# Patient Record
Sex: Female | Born: 1960 | Race: White | Hispanic: No | Smoking: Former smoker
Health system: Southern US, Community
[De-identification: ages and names within clinical notes are randomized; demographics above are authoritative.]

## PROBLEM LIST (undated history)

## (undated) DIAGNOSIS — G039 Meningitis, unspecified: Secondary | ICD-10-CM

## (undated) DIAGNOSIS — E079 Disorder of thyroid, unspecified: Secondary | ICD-10-CM

## (undated) DIAGNOSIS — J40 Bronchitis, not specified as acute or chronic: Secondary | ICD-10-CM

## (undated) DIAGNOSIS — T7840XA Allergy, unspecified, initial encounter: Secondary | ICD-10-CM

## (undated) DIAGNOSIS — K5909 Other constipation: Secondary | ICD-10-CM

## (undated) DIAGNOSIS — E785 Hyperlipidemia, unspecified: Secondary | ICD-10-CM

## (undated) DIAGNOSIS — K561 Intussusception: Secondary | ICD-10-CM

## (undated) DIAGNOSIS — J189 Pneumonia, unspecified organism: Secondary | ICD-10-CM

## (undated) HISTORY — DX: Allergy, unspecified, initial encounter: T78.40XA

## (undated) HISTORY — PX: PAROTIDECTOMY: SUR1003

## (undated) HISTORY — PX: OTHER SURGICAL HISTORY: SHX169

## (undated) HISTORY — PX: ELBOW SURGERY: SHX618

## (undated) HISTORY — PX: FOOT SURGERY: SHX648

## (undated) HISTORY — DX: Hyperlipidemia, unspecified: E78.5

## (undated) HISTORY — PX: TONSILLECTOMY: SUR1361

## (undated) HISTORY — DX: Other constipation: K59.09

## (undated) HISTORY — DX: Intussusception: K56.1

---

## 1998-07-16 ENCOUNTER — Other Ambulatory Visit: Admission: RE | Admit: 1998-07-16 | Discharge: 1998-07-16 | Payer: Self-pay | Admitting: Obstetrics & Gynecology

## 1999-09-17 ENCOUNTER — Other Ambulatory Visit: Admission: RE | Admit: 1999-09-17 | Discharge: 1999-09-17 | Payer: Self-pay | Admitting: Obstetrics & Gynecology

## 2000-01-17 ENCOUNTER — Emergency Department (HOSPITAL_COMMUNITY): Admission: EM | Admit: 2000-01-17 | Discharge: 2000-01-17 | Payer: Self-pay | Admitting: Emergency Medicine

## 2001-01-11 ENCOUNTER — Other Ambulatory Visit: Admission: RE | Admit: 2001-01-11 | Discharge: 2001-01-11 | Payer: Self-pay | Admitting: Obstetrics & Gynecology

## 2002-02-01 ENCOUNTER — Encounter: Admission: RE | Admit: 2002-02-01 | Discharge: 2002-02-01 | Payer: Self-pay | Admitting: Family Medicine

## 2002-02-01 ENCOUNTER — Encounter: Payer: Self-pay | Admitting: Family Medicine

## 2003-08-08 ENCOUNTER — Other Ambulatory Visit: Admission: RE | Admit: 2003-08-08 | Discharge: 2003-08-08 | Payer: Self-pay | Admitting: Obstetrics & Gynecology

## 2003-09-28 ENCOUNTER — Encounter: Admission: RE | Admit: 2003-09-28 | Discharge: 2003-09-28 | Payer: Self-pay | Admitting: Family Medicine

## 2004-08-08 ENCOUNTER — Other Ambulatory Visit: Admission: RE | Admit: 2004-08-08 | Discharge: 2004-08-08 | Payer: Self-pay | Admitting: Obstetrics & Gynecology

## 2005-04-18 ENCOUNTER — Emergency Department (HOSPITAL_COMMUNITY): Admission: EM | Admit: 2005-04-18 | Discharge: 2005-04-18 | Payer: Self-pay | Admitting: Emergency Medicine

## 2006-01-04 ENCOUNTER — Other Ambulatory Visit: Admission: RE | Admit: 2006-01-04 | Discharge: 2006-01-04 | Payer: Self-pay | Admitting: Obstetrics & Gynecology

## 2006-05-18 ENCOUNTER — Ambulatory Visit (HOSPITAL_COMMUNITY): Admission: RE | Admit: 2006-05-18 | Discharge: 2006-05-18 | Payer: Self-pay | Admitting: Family Medicine

## 2008-04-16 ENCOUNTER — Encounter: Admission: RE | Admit: 2008-04-16 | Discharge: 2008-04-16 | Payer: Self-pay | Admitting: Obstetrics & Gynecology

## 2008-12-05 ENCOUNTER — Emergency Department (HOSPITAL_COMMUNITY): Admission: EM | Admit: 2008-12-05 | Discharge: 2008-12-05 | Payer: Self-pay | Admitting: Emergency Medicine

## 2010-01-29 ENCOUNTER — Ambulatory Visit: Payer: Self-pay | Admitting: Diagnostic Radiology

## 2010-01-29 ENCOUNTER — Emergency Department (HOSPITAL_BASED_OUTPATIENT_CLINIC_OR_DEPARTMENT_OTHER): Admission: EM | Admit: 2010-01-29 | Discharge: 2010-01-29 | Payer: Self-pay | Admitting: Emergency Medicine

## 2010-11-09 ENCOUNTER — Encounter: Payer: Self-pay | Admitting: Family Medicine

## 2011-02-03 LAB — DIFFERENTIAL
Eosinophils Absolute: 0.5 10*3/uL (ref 0.0–0.7)
Lymphocytes Relative: 34 % (ref 12–46)
Monocytes Absolute: 0.5 10*3/uL (ref 0.1–1.0)

## 2011-02-03 LAB — COMPREHENSIVE METABOLIC PANEL
Calcium: 9.1 mg/dL (ref 8.4–10.5)
Creatinine, Ser: 0.64 mg/dL (ref 0.4–1.2)
GFR calc non Af Amer: >60 mL/min (ref >60)
Total Protein: 5.8 g/dL — ABNORMAL LOW (ref 6.0–8.3)

## 2011-02-03 LAB — CBC
Hemoglobin: 13.3 g/dL (ref 12.0–15.0)
WBC: 7.1 10*3/uL (ref 4.0–10.5)

## 2011-02-03 LAB — URINALYSIS, ROUTINE W REFLEX MICROSCOPIC
Bilirubin Urine: NEGATIVE
Glucose, UA: NEGATIVE mg/dL
Nitrite: NEGATIVE
Protein, ur: NEGATIVE mg/dL

## 2011-02-03 LAB — LIPASE, BLOOD: Lipase: 21 U/L (ref 11–59)

## 2011-06-11 ENCOUNTER — Encounter: Payer: Self-pay | Admitting: Emergency Medicine

## 2011-06-11 ENCOUNTER — Emergency Department (INDEPENDENT_AMBULATORY_CARE_PROVIDER_SITE_OTHER): Payer: Managed Care, Other (non HMO)

## 2011-06-11 ENCOUNTER — Emergency Department (HOSPITAL_BASED_OUTPATIENT_CLINIC_OR_DEPARTMENT_OTHER)
Admission: EM | Admit: 2011-06-11 | Discharge: 2011-06-11 | Disposition: A | Discharge status: Home / Self Care | Payer: Managed Care, Other (non HMO) | Attending: Emergency Medicine | Admitting: Emergency Medicine

## 2011-06-11 DIAGNOSIS — F17200 Nicotine dependence, unspecified, uncomplicated: Secondary | ICD-10-CM | POA: Insufficient documentation

## 2011-06-11 DIAGNOSIS — M549 Dorsalgia, unspecified: Secondary | ICD-10-CM | POA: Insufficient documentation

## 2011-06-11 DIAGNOSIS — R110 Nausea: Secondary | ICD-10-CM

## 2011-06-11 DIAGNOSIS — R350 Frequency of micturition: Secondary | ICD-10-CM | POA: Insufficient documentation

## 2011-06-11 DIAGNOSIS — R1032 Left lower quadrant pain: Secondary | ICD-10-CM

## 2011-06-11 DIAGNOSIS — G8929 Other chronic pain: Secondary | ICD-10-CM | POA: Insufficient documentation

## 2011-06-11 LAB — URINALYSIS, ROUTINE W REFLEX MICROSCOPIC
Bilirubin Urine: NEGATIVE
Ketones, ur: NEGATIVE mg/dL
Leukocytes, UA: NEGATIVE
Nitrite: NEGATIVE
Specific Gravity, Urine: 1.009 (ref 1.005–1.030)
Urobilinogen, UA: 0.2 mg/dL (ref 0.0–1.0)
pH: 7.5 (ref 5.0–8.0)

## 2011-06-11 LAB — BASIC METABOLIC PANEL
BUN: 6 mg/dL (ref 6–23)
CO2: 28 mEq/L (ref 19–32)
Creatinine, Ser: 0.7 mg/dL (ref 0.50–1.10)
Glucose, Bld: 92 mg/dL (ref 70–99)
Potassium: 3.7 mEq/L (ref 3.5–5.1)
Sodium: 137 mEq/L (ref 135–145)

## 2011-06-11 MED ORDER — DIAZEPAM 5 MG PO TABS: 5.0000 mg | ORAL_TABLET | Freq: Two times a day (BID) | ORAL | Status: AC

## 2011-06-11 MED ORDER — SODIUM CHLORIDE 0.9 % IV BOLUS (SEPSIS)
1000.0000 mL | Freq: Once | INTRAVENOUS | Status: AC
  Administered 2011-06-11: 1000 mL via INTRAVENOUS

## 2011-06-11 MED ORDER — KETOROLAC TROMETHAMINE 30 MG/ML IJ SOLN
30.0000 mg | Freq: Once | INTRAMUSCULAR | Status: AC
  Administered 2011-06-11: 30 mg via INTRAVENOUS
  Filled 2011-06-11: qty 1

## 2011-06-11 NOTE — ED Notes (Addendum)
NP made aware of low BP. Pt states her BP usually runs 90's or low 100's. Unsure whether BP has been this low before, but states she did take a vicodin this afternoon. Pt denies dizziness or lightheadedness. Skin W/D. HR regular.
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
## 2011-06-11 NOTE — ED Notes (Signed)
Patient c/o left lower back pain radiating to the lower abdominal area.

## 2011-06-11 NOTE — ED Notes (Signed)
Pt ambulatory to restroom without difficulty.

## 2012-01-22 ENCOUNTER — Encounter (HOSPITAL_BASED_OUTPATIENT_CLINIC_OR_DEPARTMENT_OTHER): Payer: Self-pay

## 2012-01-22 ENCOUNTER — Emergency Department (INDEPENDENT_AMBULATORY_CARE_PROVIDER_SITE_OTHER): Payer: Managed Care, Other (non HMO)

## 2012-01-22 ENCOUNTER — Emergency Department (HOSPITAL_BASED_OUTPATIENT_CLINIC_OR_DEPARTMENT_OTHER)
Admission: EM | Admit: 2012-01-22 | Discharge: 2012-01-23 | Disposition: A | Discharge status: Home / Self Care | Payer: Managed Care, Other (non HMO) | Attending: Emergency Medicine | Admitting: Emergency Medicine

## 2012-01-22 DIAGNOSIS — B349 Viral infection, unspecified: Secondary | ICD-10-CM

## 2012-01-22 DIAGNOSIS — M542 Cervicalgia: Secondary | ICD-10-CM | POA: Insufficient documentation

## 2012-01-22 DIAGNOSIS — M549 Dorsalgia, unspecified: Secondary | ICD-10-CM

## 2012-01-22 DIAGNOSIS — B9789 Other viral agents as the cause of diseases classified elsewhere: Secondary | ICD-10-CM | POA: Insufficient documentation

## 2012-01-22 DIAGNOSIS — R509 Fever, unspecified: Secondary | ICD-10-CM

## 2012-01-22 DIAGNOSIS — Z79899 Other long term (current) drug therapy: Secondary | ICD-10-CM | POA: Insufficient documentation

## 2012-01-22 DIAGNOSIS — R0789 Other chest pain: Secondary | ICD-10-CM

## 2012-01-22 DIAGNOSIS — F17200 Nicotine dependence, unspecified, uncomplicated: Secondary | ICD-10-CM | POA: Insufficient documentation

## 2012-01-22 DIAGNOSIS — R112 Nausea with vomiting, unspecified: Secondary | ICD-10-CM | POA: Insufficient documentation

## 2012-01-22 HISTORY — DX: Pneumonia, unspecified organism: J18.9

## 2012-01-22 HISTORY — DX: Bronchitis, not specified as acute or chronic: J40

## 2012-01-22 LAB — CBC
HCT: 42.9 % (ref 36.0–46.0)
Hemoglobin: 14.9 g/dL (ref 12.0–15.0)
RBC: 4.91 MIL/uL (ref 3.87–5.11)
WBC: 15.2 10*3/uL — ABNORMAL HIGH (ref 4.0–10.5)

## 2012-01-22 LAB — DIFFERENTIAL
Basophils Relative: 0 % (ref 0–1)
Lymphocytes Relative: 9 % — ABNORMAL LOW (ref 12–46)
Lymphs Abs: 1.4 10*3/uL (ref 0.7–4.0)
Monocytes Absolute: 1 10*3/uL (ref 0.1–1.0)
Monocytes Relative: 6 % (ref 3–12)
Neutro Abs: 12.6 10*3/uL — ABNORMAL HIGH (ref 1.7–7.7)
Neutrophils Relative %: 83 % — ABNORMAL HIGH (ref 43–77)

## 2012-01-22 MED ORDER — LIDOCAINE HCL (PF) 1 % IJ SOLN: 5.0000 mL | Freq: Once | INTRAMUSCULAR | Status: DC

## 2012-01-22 MED ORDER — SODIUM CHLORIDE 0.9 % IV BOLUS (SEPSIS)
1000.0000 mL | Freq: Once | INTRAVENOUS | Status: AC
  Administered 2012-01-22: 1000 mL via INTRAVENOUS

## 2012-01-22 MED ORDER — ONDANSETRON HCL 4 MG/2ML IJ SOLN
4.0000 mg | Freq: Once | INTRAMUSCULAR | Status: AC
  Administered 2012-01-22: 4 mg via INTRAVENOUS
  Filled 2012-01-22: qty 2

## 2012-01-22 MED ORDER — KETOROLAC TROMETHAMINE 30 MG/ML IJ SOLN
30.0000 mg | Freq: Once | INTRAMUSCULAR | Status: AC
  Administered 2012-01-22: 30 mg via INTRAVENOUS
  Filled 2012-01-22: qty 1

## 2012-01-22 NOTE — ED Notes (Signed)
C/o pain to head, posterior neck and entire back-started while walking yesterday-also c/o fever since yesterday-n/v started today-completed abx for bronchitis today

## 2012-01-22 NOTE — ED Notes (Signed)
Pts visitor states pt's daughter had viral meningitis last week. Pt states HA has progressed in severity and increases when laying down and with movement.

## 2012-01-22 NOTE — ED Provider Notes (Signed)
History     CSN: [id number]  Arrival date & time 01/22/12  2217   First MD Initiated Contact with Patient 01/22/12 2302      Chief Complaint  Patient presents with  . Neck Pain  . Back Pain    (Consider location/radiation/quality/duration/timing/severity/associated sxs/prior treatment) HPI The patient complains of headache, neck pain, cough, subjective fever, nausea, vomiting.  She notes that following several weeks of mild cough, congestion she was started on azithromycin 2 days ago.  She completed one full course, and was started on an extended course, scheduled to finish tomorrow.  She notes that her mild cough and congestion more persistent until yesterday when she developed the aforementioned symptoms.  Since the symptoms began she has been persistently uncomfortable, without relief from OTC medication.  She denies any confusion, syncope, ataxia urine symptoms are worse with activity. The patient has a history of viral meningitis in the distant past. Past Medical History  Diagnosis Date  . Hypotension   . Bronchitis   . Pneumonia     History reviewed. No pertinent past surgical history.  No family history on file.  History  Substance Use Topics  . Smoking status: Current Everyday Smoker -- 0.5 packs/day for 15 years    Types: Cigarettes  . Smokeless tobacco: Not on file  . Alcohol Use: Yes    OB History    Grav Para Term Preterm Abortions TAB SAB Ect Mult Living                  Review of Systems  Constitutional:       HPI  HENT:       HPI otherwise negative  Eyes: Negative.   Respiratory:       HPI, otherwise negative  Cardiovascular:       HPI, otherwise nmegative  Gastrointestinal: Positive for nausea and vomiting. Negative for diarrhea.  Genitourinary:       HPI, otherwise negative  Musculoskeletal:       HPI, otherwise negative  Skin: Negative.   Neurological: Positive for light-headedness and headaches. Negative for dizziness, seizures, syncope,  speech difficulty, weakness and numbness.    Allergies  Tetracyclines & related and Codeine  Home Medications   Current Outpatient Rx  Name Route Sig Dispense Refill  . AZITHROMYCIN 250 MG PO TABS Oral Take 250 mg by mouth daily.    Kimberlee Nearing PERLES PO Oral Take 1 capsule by mouth daily as needed.    Marland Kitchen FLEXERIL PO Oral Take 1 tablet by mouth daily as needed.    Marland Kitchen LEVOTHYROXINE SODIUM 125 MCG PO TABS Oral Take 125 mcg by mouth every evening.      Marland Kitchen NAPROXEN SODIUM 220 MG PO TABS Oral Take 220 mg by mouth 2 (two) times daily with a meal. Patient used this medication for pain.    Marland Kitchen TRAMADOL HCL ER PO Oral Take 1 tablet by mouth daily as needed.    Marland Kitchen VITAMIN D (ERGOCALCIFEROL) PO Oral Take 1 tablet by mouth daily.        BP 128/56  Pulse 98  Temp(Src) 98 F (36.7 C) (Oral)  Resp 20  Ht 5\' 4"  (1.626 m)  SpO2 98%  LMP 01/18/2012  Physical Exam  Nursing note and vitals reviewed. Constitutional: She is oriented to person, place, and time. She appears well-developed and well-nourished. No distress.  HENT:  Head: Normocephalic and atraumatic.  Eyes: Conjunctivae and EOM are normal.  Neck: Trachea normal. No spinous process tenderness and no  muscular tenderness present. No rigidity. Decreased range of motion present. No edema and no erythema present.  Cardiovascular: Normal rate and regular rhythm.   Pulmonary/Chest: Effort normal and breath sounds normal. No stridor. No respiratory distress.  Abdominal: She exhibits no distension.  Musculoskeletal: She exhibits no edema.  Neurological: She is alert and oriented to person, place, and time. No cranial nerve deficit.  Skin: Skin is warm and dry.  Psychiatric: She has a normal mood and affect.    ED Course  Procedures (including critical care time)  Labs Reviewed  CBC - Abnormal; Notable for the following:    WBC 15.2 (*)    Platelets 508 (*)    All other components within normal limits  DIFFERENTIAL - Abnormal; Notable for the  following:    Neutrophils Relative 83 (*)    Neutro Abs 12.6 (*)    Lymphocytes Relative 9 (*)    All other components within normal limits  COMPREHENSIVE METABOLIC PANEL  LIPASE, BLOOD  URINALYSIS, ROUTINE W REFLEX MICROSCOPIC   Dg Chest 2 View  01/22/2012  *RADIOLOGY REPORT*  Clinical Data: Fever, back pain  CHEST - 2 VIEW  Comparison: 01/12/2012  Findings: Lungs are essentially clear. No pleural effusion or pneumothorax.  Nodular opacities overlying the bilateral lower lobes reflect nipple shadows.  The heart is normal in size.  Visualized osseous structures are within normal limits.  IMPRESSION: No evidence of acute cardiopulmonary disease.  Original Report Authenticated By: Charline Bills, M.D.   X-ray reviewed by me  CT reviewed by me  Cardiac monitor 91 sinus rhythm normal Pulse oximetry 99% on room air normal  No diagnosis found.    MDM  This 51 year old female presents with several days of generalized complaints and specific neck, back pain.  The patient's endorsement of ongoing illness, not responsive to antibiotics is suggested viral syndrome.  On my initial exam the patient was in no distress though she was uncomfortable appearing.  The patient's neck was supple though she was hesitant to move it.  However, she was hesitant to move anything, do to diffuse discomfort.  The patient was not confused, disorientated.  She was also afebrile.  Given the patient's description of headache neck pain, her leukocytosis, there's suspicion of meningitis, though bacterial meningitis seems unlikely.  With this concern, the patient had had a CT to rule out elevated ICP, and the possibility of pseudotumor as the principal etiology.  The patient's CT was negative, and her chest x-ray did not demonstrate pneumonia.  With these findings, the persistent concern for meningitis, particularly in light of the patient's prior history of viral meningitis, I anticipated performing a lumbar puncture.  The  patient signed consent for this procedure other than change her mind.  She stated that she has had spinal headache in the past and defers this evaluation.  Explaining risks and benefits of not performing the procedure.  The patient acknowledged the risks and confirm her decision not to have a lumbar puncture.  Following additional IV fluids, analgesics, the patient's pain was significantly improved.  She was ambulatory without any complaints.  She was discharged in fair condition to follow up with her primary care physician.   Gerhard Munch, MD 01/23/12 [phone]

## 2012-01-23 ENCOUNTER — Emergency Department (INDEPENDENT_AMBULATORY_CARE_PROVIDER_SITE_OTHER): Payer: Managed Care, Other (non HMO)

## 2012-01-23 DIAGNOSIS — R112 Nausea with vomiting, unspecified: Secondary | ICD-10-CM

## 2012-01-23 DIAGNOSIS — R509 Fever, unspecified: Secondary | ICD-10-CM

## 2012-01-23 DIAGNOSIS — R51 Headache: Secondary | ICD-10-CM

## 2012-01-23 DIAGNOSIS — J320 Chronic maxillary sinusitis: Secondary | ICD-10-CM

## 2012-01-23 LAB — COMPREHENSIVE METABOLIC PANEL
Albumin: 4.3 g/dL (ref 3.5–5.2)
Alkaline Phosphatase: 51 U/L (ref 39–117)
BUN: 7 mg/dL (ref 6–23)
CO2: 24 mEq/L (ref 19–32)
Chloride: 99 mEq/L (ref 96–112)
Creatinine, Ser: 0.7 mg/dL (ref 0.50–1.10)
GFR calc non Af Amer: >90 mL/min (ref >90)
Potassium: 4.1 mEq/L (ref 3.5–5.1)
Total Bilirubin: 0.3 mg/dL (ref 0.3–1.2)

## 2012-01-23 LAB — URINALYSIS, ROUTINE W REFLEX MICROSCOPIC
Glucose, UA: NEGATIVE mg/dL
Ketones, ur: >80 mg/dL — AB
Nitrite: NEGATIVE
Protein, ur: NEGATIVE mg/dL
pH: 6 (ref 5.0–8.0)

## 2012-01-23 LAB — LIPASE, BLOOD: Lipase: 24 U/L (ref 11–59)

## 2012-01-23 MED ORDER — HYDROMORPHONE HCL PF 1 MG/ML IJ SOLN
0.5000 mg | Freq: Once | INTRAMUSCULAR | Status: AC
  Administered 2012-01-23: 0.5 mg via INTRAVENOUS
  Filled 2012-01-23: qty 1

## 2012-01-23 MED ORDER — ONDANSETRON HCL 4 MG/2ML IJ SOLN
4.0000 mg | Freq: Once | INTRAMUSCULAR | Status: AC
  Administered 2012-01-23: 4 mg via INTRAVENOUS
  Filled 2012-01-23: qty 2

## 2012-01-23 MED ORDER — SODIUM CHLORIDE 0.9 % IV BOLUS (SEPSIS)
1000.0000 mL | Freq: Once | INTRAVENOUS | Status: AC
  Administered 2012-01-23: 1000 mL via INTRAVENOUS

## 2012-01-23 NOTE — Discharge Instructions (Signed)
It is extremely important that you follow up with your primary care physician, as we discussed.  Although you did not have the lumbar puncture performed today, he may return if he feels that your symptoms are worsening in any way for this study.  Her presentation is most consistent with a viral syndrome, with concern for possible viral meningitis.  Again, if you develop any new, or concerning changes in your condition, please return to the emergency department immediately.

## 2012-01-23 NOTE — ED Notes (Signed)
Pt ambulated to bathroom without distress. Urine sent. Return to monitor. Visitor at bedside. Pt states she is feeling better.

## 2012-01-23 NOTE — ED Notes (Signed)
Pt to ct scan via stretcher

## 2012-01-23 NOTE — ED Notes (Signed)
Pt return from ct scan states feeling better.

## 2012-01-23 NOTE — ED Notes (Signed)
Ambulates out with out distress with family

## 2012-01-23 NOTE — ED Notes (Signed)
Pt placed on bedpan unable to obtain specimen

## 2012-01-23 NOTE — ED Notes (Signed)
Consent signed for lp

## 2012-01-23 NOTE — ED Notes (Signed)
Pt aware of plan of care. Pt request more pain medication

## 2012-01-24 ENCOUNTER — Inpatient Hospital Stay (HOSPITAL_BASED_OUTPATIENT_CLINIC_OR_DEPARTMENT_OTHER)
Admission: EM | Admit: 2012-01-24 | Discharge: 2012-01-29 | DRG: 094 | Disposition: A | Discharge status: Home / Self Care | Payer: Managed Care, Other (non HMO) | Attending: Internal Medicine | Admitting: Internal Medicine

## 2012-01-24 ENCOUNTER — Encounter (HOSPITAL_BASED_OUTPATIENT_CLINIC_OR_DEPARTMENT_OTHER): Payer: Self-pay | Admitting: Emergency Medicine

## 2012-01-24 DIAGNOSIS — IMO0002 Reserved for concepts with insufficient information to code with codable children: Secondary | ICD-10-CM

## 2012-01-24 DIAGNOSIS — E2749 Other adrenocortical insufficiency: Secondary | ICD-10-CM | POA: Diagnosis present

## 2012-01-24 DIAGNOSIS — I959 Hypotension, unspecified: Secondary | ICD-10-CM | POA: Diagnosis present

## 2012-01-24 DIAGNOSIS — Z888 Allergy status to other drugs, medicaments and biological substances status: Secondary | ICD-10-CM

## 2012-01-24 DIAGNOSIS — R031 Nonspecific low blood-pressure reading: Secondary | ICD-10-CM | POA: Diagnosis present

## 2012-01-24 DIAGNOSIS — G009 Bacterial meningitis, unspecified: Principal | ICD-10-CM | POA: Diagnosis present

## 2012-01-24 DIAGNOSIS — E039 Hypothyroidism, unspecified: Secondary | ICD-10-CM | POA: Diagnosis present

## 2012-01-24 DIAGNOSIS — E2740 Unspecified adrenocortical insufficiency: Secondary | ICD-10-CM | POA: Diagnosis present

## 2012-01-24 DIAGNOSIS — R519 Headache, unspecified: Secondary | ICD-10-CM | POA: Diagnosis present

## 2012-01-24 DIAGNOSIS — R110 Nausea: Secondary | ICD-10-CM | POA: Diagnosis present

## 2012-01-24 DIAGNOSIS — R51 Headache: Secondary | ICD-10-CM | POA: Diagnosis present

## 2012-01-24 DIAGNOSIS — F17200 Nicotine dependence, unspecified, uncomplicated: Secondary | ICD-10-CM | POA: Diagnosis present

## 2012-01-24 DIAGNOSIS — J189 Pneumonia, unspecified organism: Secondary | ICD-10-CM | POA: Diagnosis present

## 2012-01-24 DIAGNOSIS — G039 Meningitis, unspecified: Secondary | ICD-10-CM | POA: Diagnosis present

## 2012-01-24 DIAGNOSIS — Z79899 Other long term (current) drug therapy: Secondary | ICD-10-CM

## 2012-01-24 DIAGNOSIS — Z23 Encounter for immunization: Secondary | ICD-10-CM

## 2012-01-24 HISTORY — DX: Meningitis, unspecified: G03.9

## 2012-01-24 LAB — CSF CELL COUNT WITH DIFFERENTIAL
Lymphs, CSF: 96 % — ABNORMAL HIGH (ref 40–80)
Lymphs, CSF: 98 % — ABNORMAL HIGH (ref 40–80)
Monocyte-Macrophage-Spinal Fluid: 2 % — ABNORMAL LOW (ref 15–45)
RBC Count, CSF: 1 /mm3 — ABNORMAL HIGH
Segmented Neutrophils-CSF: 2 % (ref 0–6)
Tube #: 4
WBC, CSF: 720 /mm3 (ref 0–5)

## 2012-01-24 LAB — GRAM STAIN

## 2012-01-24 LAB — BASIC METABOLIC PANEL
BUN: 5 mg/dL — ABNORMAL LOW (ref 6–23)
Calcium: 8.8 mg/dL (ref 8.4–10.5)
Creatinine, Ser: 0.6 mg/dL (ref 0.50–1.10)
GFR calc Af Amer: >90 mL/min (ref >90)
GFR calc non Af Amer: >90 mL/min (ref >90)

## 2012-01-24 LAB — DIFFERENTIAL
Basophils Absolute: 0.1 10*3/uL (ref 0.0–0.1)
Basophils Relative: 1 % (ref 0–1)
Eosinophils Absolute: 0.2 10*3/uL (ref 0.0–0.7)
Monocytes Absolute: 0.8 10*3/uL (ref 0.1–1.0)
Monocytes Relative: 11 % (ref 3–12)

## 2012-01-24 LAB — CBC
HCT: 39.4 % (ref 36.0–46.0)
Hemoglobin: 13.6 g/dL (ref 12.0–15.0)
MCH: 30.5 pg (ref 26.0–34.0)
MCHC: 34.5 g/dL (ref 30.0–36.0)
RDW: 14.7 % (ref 11.5–15.5)

## 2012-01-24 LAB — PROTEIN AND GLUCOSE, CSF: Glucose, CSF: 38 mg/dL — ABNORMAL LOW (ref 43–76)

## 2012-01-24 MED ORDER — TRAMADOL HCL 50 MG PO TABS
50.0000 mg | ORAL_TABLET | Freq: Four times a day (QID) | ORAL | Status: DC | PRN
  Administered 2012-01-25: 50 mg via ORAL

## 2012-01-24 MED ORDER — LEVOTHYROXINE SODIUM 125 MCG PO TABS
125.0000 ug | ORAL_TABLET | Freq: Every evening | ORAL | Status: DC
  Administered 2012-01-25 – 2012-01-29 (×5): 125 ug via ORAL
  Filled 2012-01-24 (×5): qty 1

## 2012-01-24 MED ORDER — BENZONATATE 100 MG PO CAPS
100.0000 mg | ORAL_CAPSULE | Freq: Three times a day (TID) | ORAL | Status: DC | PRN
  Administered 2012-01-25: 100 mg via ORAL
  Filled 2012-01-24: qty 1

## 2012-01-24 MED ORDER — HYDROCODONE-ACETAMINOPHEN 5-325 MG PO TABS
1.0000 | ORAL_TABLET | ORAL | Status: DC | PRN
  Administered 2012-01-24 – 2012-01-28 (×6): 2 via ORAL
  Administered 2012-01-28: 1 via ORAL
  Administered 2012-01-28 – 2012-01-29 (×2): 2 via ORAL
  Filled 2012-01-24 (×4): qty 2
  Filled 2012-01-24: qty 1
  Filled 2012-01-24 (×5): qty 2

## 2012-01-24 MED ORDER — ACETAMINOPHEN 325 MG PO TABS
650.0000 mg | ORAL_TABLET | Freq: Four times a day (QID) | ORAL | Status: DC | PRN
  Filled 2012-01-24: qty 2

## 2012-01-24 MED ORDER — PNEUMOCOCCAL VAC POLYVALENT 25 MCG/0.5ML IJ INJ
0.5000 mL | INJECTION | INTRAMUSCULAR | Status: AC
  Filled 2012-01-24: qty 0.5

## 2012-01-24 MED ORDER — ONDANSETRON HCL 4 MG/2ML IJ SOLN: 4.0000 mg | Freq: Four times a day (QID) | INTRAMUSCULAR | Status: DC | PRN

## 2012-01-24 MED ORDER — ALUM & MAG HYDROXIDE-SIMETH 200-200-20 MG/5ML PO SUSP: 30.0000 mL | Freq: Four times a day (QID) | ORAL | Status: DC | PRN

## 2012-01-24 MED ORDER — HYDROMORPHONE HCL PF 1 MG/ML IJ SOLN
1.0000 mg | Freq: Once | INTRAMUSCULAR | Status: AC
  Administered 2012-01-24: 1 mg via INTRAVENOUS
  Filled 2012-01-24: qty 1

## 2012-01-24 MED ORDER — ACETAMINOPHEN 650 MG RE SUPP: 650.0000 mg | Freq: Four times a day (QID) | RECTAL | Status: DC | PRN

## 2012-01-24 MED ORDER — DEXAMETHASONE SODIUM PHOSPHATE 10 MG/ML IJ SOLN
10.0000 mg | Freq: Once | INTRAMUSCULAR | Status: AC
  Administered 2012-01-24: 10 mg via INTRAVENOUS
  Filled 2012-01-24: qty 1

## 2012-01-24 MED ORDER — IPRATROPIUM BROMIDE 0.02 % IN SOLN: 0.5000 mg | RESPIRATORY_TRACT | Status: DC | PRN

## 2012-01-24 MED ORDER — VANCOMYCIN HCL IN DEXTROSE 1-5 GM/200ML-% IV SOLN
1000.0000 mg | Freq: Once | INTRAVENOUS | Status: AC
  Administered 2012-01-24: 1000 mg via INTRAVENOUS
  Filled 2012-01-24: qty 200

## 2012-01-24 MED ORDER — ALBUTEROL SULFATE (5 MG/ML) 0.5% IN NEBU: 2.5000 mg | INHALATION_SOLUTION | RESPIRATORY_TRACT | Status: DC | PRN

## 2012-01-24 MED ORDER — METOCLOPRAMIDE HCL 5 MG/ML IJ SOLN
10.0000 mg | Freq: Once | INTRAMUSCULAR | Status: AC
  Administered 2012-01-24: 10 mg via INTRAVENOUS
  Filled 2012-01-24: qty 2

## 2012-01-24 MED ORDER — VANCOMYCIN HCL 1000 MG IV SOLR
750.0000 mg | Freq: Two times a day (BID) | INTRAVENOUS | Status: DC
  Administered 2012-01-25 – 2012-01-26 (×3): 750 mg via INTRAVENOUS
  Filled 2012-01-24 (×5): qty 750

## 2012-01-24 MED ORDER — CEFTRIAXONE SODIUM 2 G IJ SOLR
2.0000 g | Freq: Two times a day (BID) | INTRAMUSCULAR | Status: DC
  Administered 2012-01-24 – 2012-01-29 (×10): 2 g via INTRAVENOUS
  Filled 2012-01-24 (×11): qty 2

## 2012-01-24 MED ORDER — DEXTROSE 5 % IV SOLN
2.0000 g | Freq: Once | INTRAVENOUS | Status: AC
  Administered 2012-01-24: 2 g via INTRAVENOUS
  Filled 2012-01-24: qty 2

## 2012-01-24 MED ORDER — MORPHINE SULFATE 2 MG/ML IJ SOLN
2.0000 mg | INTRAMUSCULAR | Status: DC | PRN
  Administered 2012-01-26 – 2012-01-27 (×3): 2 mg via INTRAVENOUS
  Filled 2012-01-24 (×3): qty 1

## 2012-01-24 MED ORDER — ONDANSETRON HCL 4 MG PO TABS: 4.0000 mg | ORAL_TABLET | Freq: Four times a day (QID) | ORAL | Status: DC | PRN

## 2012-01-24 MED ORDER — LIDOCAINE HCL (PF) 1 % IJ SOLN
INTRAMUSCULAR | Status: AC
  Filled 2012-01-24: qty 10

## 2012-01-24 MED ORDER — DIPHENHYDRAMINE HCL 50 MG/ML IJ SOLN
25.0000 mg | Freq: Once | INTRAMUSCULAR | Status: AC
  Administered 2012-01-24: 25 mg via INTRAVENOUS
  Filled 2012-01-24: qty 1

## 2012-01-24 MED ORDER — SODIUM CHLORIDE 0.9 % IJ SOLN: 3.0000 mL | INTRAMUSCULAR | Status: DC | PRN

## 2012-01-24 MED ORDER — CYCLOBENZAPRINE HCL 10 MG PO TABS
5.0000 mg | ORAL_TABLET | Freq: Three times a day (TID) | ORAL | Status: DC | PRN
  Administered 2012-01-24 – 2012-01-29 (×2): 5 mg via ORAL
  Filled 2012-01-24 (×2): qty 1

## 2012-01-24 MED ORDER — SODIUM CHLORIDE 0.9 % IV BOLUS (SEPSIS)
1000.0000 mL | Freq: Once | INTRAVENOUS | Status: AC
  Administered 2012-01-24: 1000 mL via INTRAVENOUS

## 2012-01-24 MED ORDER — LIOTHYRONINE SODIUM 25 MCG PO TABS
12.5000 ug | ORAL_TABLET | Freq: Every day | ORAL | Status: DC
  Administered 2012-01-25 – 2012-01-28 (×4): 12.5 ug via ORAL
  Administered 2012-01-29: 10:00:00 via ORAL
  Filled 2012-01-24 (×5): qty 1

## 2012-01-24 NOTE — ED Provider Notes (Signed)
History     CSN: [id number]  Arrival date & time 01/24/12  1223   First MD Initiated Contact with Patient 01/24/12 1308      Chief Complaint  Patient presents with  . Headache  . Back Pain    (Consider location/radiation/quality/duration/timing/severity/associated sxs/prior treatment) Patient is a 51 y.o. female presenting with headaches and back pain. The history is provided by the patient. No language interpreter was used.  Headache  This is a new problem. The current episode started more than 2 days ago (4 days ago). The problem occurs constantly. The problem has been gradually worsening. The headache is associated with nothing. The pain is located in the frontal, temporal, occipital and parietal region. The pain is severe. The pain does not radiate. Associated symptoms include anorexia, malaise/fatigue and nausea. Pertinent negatives include no fever, no near-syncope, no palpitations, no shortness of breath and no vomiting. Treatments tried: Was seen here proximally 2 days ago. Had a CT scan which was negative. Encouraged to have a lumbar puncture performed but the patient refused. Once the medication wore off her headache returned.  Back Pain  This is a chronic problem. The current episode started more than 2 days ago (4 days ago). The problem occurs constantly. The problem has been gradually worsening. The pain is associated with no known injury. The pain is present in the lumbar spine. The pain is moderate. Associated symptoms include headaches. Pertinent negatives include no chest pain, no fever, no numbness, no abdominal pain, no dysuria and no weakness.    Past Medical History  Diagnosis Date  . Hypotension   . Bronchitis   . Pneumonia     History reviewed. No pertinent past surgical history.  No family history on file.  History  Substance Use Topics  . Smoking status: Current Everyday Smoker -- 0.5 packs/day for 15 years    Types: Cigarettes  . Smokeless tobacco: Not  on file  . Alcohol Use: Yes    OB History    Grav Para Term Preterm Abortions TAB SAB Ect Mult Living                  Review of Systems  Constitutional: Positive for malaise/fatigue and fatigue. Negative for fever, activity change and appetite change.  HENT: Positive for neck pain. Negative for congestion, sore throat, rhinorrhea and neck stiffness.   Respiratory: Positive for cough. Negative for shortness of breath.   Cardiovascular: Negative for chest pain, palpitations and near-syncope.  Gastrointestinal: Positive for nausea and anorexia. Negative for vomiting and abdominal pain.  Genitourinary: Negative for dysuria, urgency, frequency and flank pain.  Musculoskeletal: Positive for back pain. Negative for myalgias and arthralgias.  Neurological: Positive for headaches. Negative for dizziness, weakness, light-headedness and numbness.  All other systems reviewed and are negative.    Allergies  Tetracyclines & related and Codeine  Home Medications   Current Outpatient Rx  Name Route Sig Dispense Refill  . AZITHROMYCIN 250 MG PO TABS Oral Take 250 mg by mouth daily.    Kimberlee Nearing PERLES PO Oral Take 1 capsule by mouth daily as needed.    Marland Kitchen FLEXERIL PO Oral Take 1 tablet by mouth daily as needed.    Marland Kitchen LEVOTHYROXINE SODIUM 125 MCG PO TABS Oral Take 125 mcg by mouth every evening.      Marland Kitchen NAPROXEN SODIUM 220 MG PO TABS Oral Take 220 mg by mouth 2 (two) times daily with a meal. Patient used this medication for pain.    Marland Kitchen  TRAMADOL HCL ER PO Oral Take 1 tablet by mouth daily as needed.    Marland Kitchen VITAMIN D (ERGOCALCIFEROL) PO Oral Take 1 tablet by mouth daily.        BP 125/50  Pulse 60  Temp(Src) 97.8 F (36.6 C) (Oral)  Resp 18  SpO2 99%  LMP 01/18/2012  Physical Exam  Nursing note and vitals reviewed. Constitutional: She is oriented to person, place, and time. She appears well-developed and well-nourished.       Appears uncomfortable  HENT:  Head: Normocephalic and  atraumatic.  Mouth/Throat: Oropharynx is clear and moist.  Eyes: Conjunctivae and EOM are normal. Pupils are equal, round, and reactive to light.  Neck:       No meningismus but did have limited rom of neck due to HA  Cardiovascular: Normal rate, regular rhythm, normal heart sounds and intact distal pulses.  Exam reveals no gallop and no friction rub.   No murmur heard. Pulmonary/Chest: Effort normal and breath sounds normal. No respiratory distress. She exhibits no tenderness.  Abdominal: Soft. Bowel sounds are normal. There is no tenderness.  Musculoskeletal: Normal range of motion. She exhibits no tenderness.  Lymphadenopathy:    She has no cervical adenopathy.  Neurological: She is alert and oriented to person, place, and time. No cranial nerve deficit.  Skin: Skin is warm and dry. No rash noted.    ED Course  LUMBAR PUNCTURE Date/Time: 01/24/2012 2:30 PM Performed by: Dayton Bailiff Authorized by: Dayton Bailiff Consent: Written consent obtained. The procedure was performed in an emergent situation. Risks and benefits: risks, benefits and alternatives were discussed Consent given by: patient Required items: required blood products, implants, devices, and special equipment available Patient identity confirmed: arm band Time out: Immediately prior to procedure a "time out" was called to verify the correct patient, procedure, equipment, support staff and site/side marked as required. Indications: evaluation for infection and evaluation for subarachnoid hemorrhage Local anesthetic: lidocaine 1% without epinephrine Anesthetic total: 3 ml Patient sedated: no Preparation: Patient was prepped and draped in the usual sterile fashion. Lumbar space: L3-L4 interspace Patient's position: left lateral decubitus Needle gauge: 22 Needle type: spinal needle - Quincke tip Needle length: 3.5 in Number of attempts: 2 Fluid appearance: clear Tubes of fluid: 4 Total volume: 4 ml Post-procedure:  site cleaned and adhesive bandage applied Patient tolerance: Patient tolerated the procedure well with no immediate complications. Comments: Moved numerous times during the procedure   (including critical care time)   Labs Reviewed  CBC  DIFFERENTIAL  BASIC METABOLIC PANEL  URINALYSIS, ROUTINE W REFLEX MICROSCOPIC  CSF CELL COUNT WITH DIFFERENTIAL  PROTEIN AND GLUCOSE, CSF  CSF CULTURE   Dg Chest 2 View  01/22/2012  *RADIOLOGY REPORT*  Clinical Data: Fever, back pain  CHEST - 2 VIEW  Comparison: 01/12/2012  Findings: Lungs are essentially clear. No pleural effusion or pneumothorax.  Nodular opacities overlying the bilateral lower lobes reflect nipple shadows.  The heart is normal in size.  Visualized osseous structures are within normal limits.  IMPRESSION: No evidence of acute cardiopulmonary disease.  Original Report Authenticated By: Charline Bills, M.D.   Ct Head Wo Contrast  01/23/2012  *RADIOLOGY REPORT*  Clinical Data: Fever.  Nausea and vomiting.  Pain in neck and head.  CT HEAD WITHOUT CONTRAST  Technique:  Contiguous axial images were obtained from the base of the skull through the vertex without contrast.  Comparison: None.  Findings: The brain stem, cerebellum, cerebral peduncles, thalami, basal ganglia, basilar cisterns, and  ventricular system appear unremarkable.  No intracranial hemorrhage, mass lesion, or acute infarction is identified.  The basilar cisterns are patent.  There is minimal chronic right maxillary sinusitis.  IMPRESSION:  1.  Minimal chronic right maxillary sinusitis.   Otherwise, no significant abnormality identified.  Original Report Authenticated By: Dellia Cloud, M.D.     No diagnosis found.    MDM  Concern for meningitis versus subarachnoid hemorrhage based on history. She describes the headache is sudden onset and more severe than typical headaches. She has no neurologic symptoms. A CT scan performed at her previous visit. Lumbar puncture was  performed. The majority of the CSF testing was be performed at an outside facility. She is placed on Rocephin and vancomycin after identifying a low glucose and elevated protein. Concern for meningitis. We will await cell count. Signed out to my colleague Dr. Bebe Shaggy who will assume care        Dayton Bailiff, MD 01/24/12 1529

## 2012-01-24 NOTE — Progress Notes (Signed)
ANTIBIOTIC CONSULT NOTE - INITIAL  Pharmacy Consult for Vancomycin Indication: Meningitis  Allergies  Allergen Reactions  . Tetracyclines & Related Anaphylaxis  . Codeine Nausea And Vomiting    Patient Measurements: Height: 5\' 4"  (162.6 cm) Weight: 120 lb (54.432 kg) IBW/kg (Calculated) : 54.7   Vital Signs: Temp: 99.6 F (37.6 C) (04/07 2048) Temp src: Oral (04/07 2048) BP: 118/53 mmHg (04/07 2048) Pulse Rate: 69  (04/07 2048) Intake/Output from previous day:   Intake/Output from this shift:    Labs:  [facility] 01/24/12 1335 01/22/12 2337  WBC 7.2 15.2*  HGB 13.6 14.9  PLT 434* 508*  LABCREA -- --  CREATININE 0.60 0.70   Estimated Creatinine Clearance: 73.9 ml/min (by C-G formula based on Cr of 0.6). No results found for this basename: VANCOTROUGH:2,VANCOPEAK:2,VANCORANDOM:2,GENTTROUGH:2,GENTPEAK:2,GENTRANDOM:2,TOBRATROUGH:2,TOBRAPEAK:2,TOBRARND:2,AMIKACINPEAK:2,AMIKACINTROU:2,AMIKACIN:2, in the last 72 hours   Microbiology: Recent Results (from the past 720 hour(s))  GRAM STAIN     Status: Normal   Collection Time   01/24/12  2:00 PM      Component Value Range Status Comment   Specimen Description CSF   Final    Special Requests NONE   Final    Gram Stain     Final    Value: CYTOSPIN SLIDE     WBC PRESENT, PREDOMINANTLY MONONUCLEAR     NO ORGANISMS SEEN   Report Status 01/24/2012 FINAL   Final     Medical History: Past Medical History  Diagnosis Date  . Hypotension   . Bronchitis   . Pneumonia     Medications:  Prescriptions prior to admission  Medication Sig Dispense Refill  . Cholecalciferol (VITAMIN D) 2000 UNITS tablet Take 2,000 Units by mouth daily.      . cyclobenzaprine (FLEXERIL) 5 MG tablet Take 5 mg by mouth 3 (three) times daily as needed. For muscle spasms      . levothyroxine (SYNTHROID, LEVOTHROID) 125 MCG tablet Take 125 mcg by mouth every evening.        Marland Kitchen liothyronine (CYTOMEL) 25 MCG tablet Take 12.5 mcg by mouth daily.      .  naproxen sodium (ANAPROX) 220 MG tablet Take 220 mg by mouth 2 (two) times daily with a meal. For pain      . traMADol (ULTRAM) 50 MG tablet Take 50 mg by mouth every 6 (six) hours as needed. For pain      . azithromycin (ZITHROMAX) 250 MG tablet Take 250 mg by mouth daily. Last dose 01/24/12       Admit Complaint: 51 y.o.  female  admitted 01/24/2012 with worsening headache, LP at [facility].  Pharmacy consulted to dose Vancomycin  Assessment:  Infectious Disease: Meningitis Antibiotics: Ceftriaxone 4/7 Vancomycin 4/7 Cultures:  Goal of Therapy:  Vancomycin trough level 15-20 mcg/ml  Plan:  Vancomycin 750 mg IV q12h Follow up SCr, UOP, cultures, clinical course and adjust as clinically indicated.  Mandeep Kiser Christine Virginia Crews 01/24/2012,10:18 PM

## 2012-01-24 NOTE — ED Notes (Signed)
Provided Coke to pt (OK per Dr. Bebe Shaggy); MD aware that UA has not been obtained, no further orders at this time

## 2012-01-24 NOTE — ED Notes (Signed)
King MD at bedside.

## 2012-01-24 NOTE — ED Notes (Signed)
Pt instructed to lay flat on back x 1 hr s/p lumbar punture; pt verbalizes understanding

## 2012-01-24 NOTE — H&P (Signed)
PCP:   Lenora Boys, MD, MD   Chief Complaint:  Severe headache  HPI: This is a pleasant 51 female with no significant past medical history who presented with severe headache. She said approximately 3-4 weeks ago a viral illness was going around the office. She had this for approximately 2 weeks. She did help with her PCP for this chest x-ray, she was eventually diagnosed with pneumonia and treated with a double course of Z-Pak. On Monday she felt good. By Thursday she developed a severe headache. The headache was bitemporal and frontal. She has photophobia, stiff neck, blurred vision, fever, nausea and vomiting. The pain was severe. Friday night she went to [facility] where she was diagnosed with likely viral illness. LP was discussed, it was mutually decided that this would not be done. Her headache was much worse today, she returned in [facility] regional where a LP was done. She was thought to have meningitis based on the findings, therefore, she was transferred [facility] cone for admission. History provided by the patient is awake alert, and oriented.   Review of Systems: positives bolded   anorexia, fever, weight loss,, vision loss, decreased hearing, hoarseness, chest pain, syncope, dyspnea on exertion, peripheral edema, balance deficits, hemoptysis, abdominal pain, melena, hematochezia, severe indigestion/heartburn, hematuria, incontinence, genital sores, muscle weakness, suspicious skin lesions, transient blindness, difficulty walking, depression, unusual weight change, abnormal bleeding, enlarged lymph nodes, angioedema, and breast masses.  Past Medical History: Past Medical History  Diagnosis Date  . Hypotension   . Bronchitis   . Pneumonia    History reviewed. No pertinent past surgical history.  Medications: Prior to Admission medications   Medication Sig Start Date End Date Taking? Authorizing Provider  Cholecalciferol (VITAMIN D) 2000 UNITS tablet Take 2,000  Units by mouth daily.   Yes Historical Provider, MD  cyclobenzaprine (FLEXERIL) 5 MG tablet Take 5 mg by mouth 3 (three) times daily as needed. For muscle spasms   Yes Historical Provider, MD  levothyroxine (SYNTHROID, LEVOTHROID) 125 MCG tablet Take 125 mcg by mouth every evening.     Yes Historical Provider, MD  liothyronine (CYTOMEL) 25 MCG tablet Take 12.5 mcg by mouth daily.   Yes Historical Provider, MD  naproxen sodium (ANAPROX) 220 MG tablet Take 220 mg by mouth 2 (two) times daily with a meal. For pain   Yes Historical Provider, MD  traMADol (ULTRAM) 50 MG tablet Take 50 mg by mouth every 6 (six) hours as needed. For pain   Yes Historical Provider, MD  azithromycin (ZITHROMAX) 250 MG tablet Take 250 mg by mouth daily. Last dose 01/24/12    Historical Provider, MD    Allergies:   Allergies  Allergen Reactions  . Tetracyclines & Related Anaphylaxis  . Codeine Nausea And Vomiting    Social History:  reports that she has been smoking Cigarettes.  She has a 7.5 pack-year smoking history. She does not have any smokeless tobacco history on file. She reports that she drinks alcohol. She reports that she does not use illicit drugs.  Family History: The patient denies hypertension, diabetes.   Physical Exam: Filed Vitals:   01/24/12 1241 01/24/12 1442 01/24/12 1644 01/24/12 1905  BP: 125/50 109/51 113/49 104/54  Pulse: 60 73 71 59  Temp: 97.8 F (36.6 C)  98.7 F (37.1 C) 99.4 F (37.4 C)  TempSrc: Oral  Oral Oral  Resp: 18 16 16 16   Height:    5\' 4"  (1.626 m)  Weight:  54.432 kg (120 lb)  SpO2: 99%  98% 98%    General:  Alert and oriented times three, well developed and nourished, no acute distress Eyes: PERRLA, pink conjunctiva, no scleral icterus ENT: Moist oral mucosa, neck supple, no thyromegaly, positive nuchal rigidity Lungs: clear to ascultation, no wheeze, no crackles, no use of accessory muscles Cardiovascular: regular rate and rhythm, no regurgitation, no gallops,  no murmurs. No carotid bruits, no JVD Abdomen: soft, positive BS, non-tender, non-distended, no organomegaly, not an acute abdomen GU: not examined Neuro: CN II - XII grossly intact, sensation intact Musculoskeletal: strength 5/5 all extremities, no clubbing, cyanosis or edema Skin: no rash, no subcutaneous crepitation, no decubitus Psych: appropriate patient   Labs on Admission:   [facility] 01/24/12 1335 01/22/12 2337  NA 135 134*  K 3.7 4.1  CL 102 99  CO2 25 24  GLUCOSE 80 96  BUN 5* 7  CREATININE 0.60 0.70  CALCIUM 8.8 9.7  MG -- --  PHOS -- --    Basename 01/22/12 2337  AST 13  ALT 6  ALKPHOS 51  BILITOT 0.3  PROT 7.3  ALBUMIN 4.3    Basename 01/22/12 2337  LIPASE 24  AMYLASE --    Basename 01/24/12 1335 01/22/12 2337  WBC 7.2 15.2*  NEUTROABS 4.8 12.6*  HGB 13.6 14.9  HCT 39.4 42.9  MCV 88.3 87.4  PLT 434* 508*   No results found for this basename: CKTOTAL:3,CKMB:3,CKMBINDEX:3,TROPONINI:3 in the last 72 hours No components found with this basename: POCBNP:3 No results found for this basename: DDIMER:2 in the last 72 hours No results found for this basename: HGBA1C:2 in the last 72 hours No results found for this basename: CHOL:2,HDL:2,LDLCALC:2,TRIG:2,CHOLHDL:2,LDLDIRECT:2 in the last 72 hours No results found for this basename: TSH,T4TOTAL,FREET3,T3FREE,THYROIDAB in the last 72 hours No results found for this basename: VITAMINB12:2,FOLATE:2,FERRITIN:2,TIBC:2,IRON:2,RETICCTPCT:2 in the last 72 hours  Micro Results: Recent Results (from the past 240 hour(s))  GRAM STAIN     Status: Normal   Collection Time   01/24/12  2:00 PM      Component Value Range Status Comment   Specimen Description CSF   Final    Special Requests NONE   Final    Gram Stain     Final    Value: CYTOSPIN SLIDE     WBC PRESENT, PREDOMINANTLY MONONUCLEAR     NO ORGANISMS SEEN   Report Status 01/24/2012 FINAL   Final    Results for Tracey Flowers, Tracey Flowers (MRN [id number]) as of  01/24/2012 19:42  Ref. Range 01/24/2012 14:00 01/24/2012 14:00  Glucose, CSF Latest Range: 43-76 mg/dL 38 (L)   Total  Protein, CSF Latest Range: 15-45 mg/dL 595 (H)   RBC Count, CSF Latest Range: 0 /cu mm 1 (H) 205 (H)  WBC, CSF Latest Range: 0-5 /cu mm 820 (HH) 720 (HH)  Segmented Neutrophils-CSF Latest Range: 0-6 %  2  Lymphs, CSF Latest Range: 40-80 % 98 (H) 96 (H)  Monocyte-Macrophage-Spinal Fluid Latest Range: 15-45 % 2 (L) 2 (L)  Appearance, CSF Latest Range: CLEAR  CLEAR CLEAR  Color, CSF Latest Range: COLORLESS  COLORLESS COLORLESS  Supernatant No range found NOT INDICATED NOT INDICATED  Tube # No range found 4 1    Radiological Exams on Admission: Dg Chest 2 View  01/22/2012  *RADIOLOGY REPORT*  Clinical Data: Fever, back pain  CHEST - 2 VIEW  Comparison: 01/12/2012  Findings: Lungs are essentially clear. No pleural effusion or pneumothorax.  Nodular opacities overlying the bilateral lower  lobes reflect nipple shadows.  The heart is normal in size.  Visualized osseous structures are within normal limits.  IMPRESSION: No evidence of acute cardiopulmonary disease.  Original Report Authenticated By: Charline Bills, M.D.   Ct Head Wo Contrast  01/23/2012  *RADIOLOGY REPORT*  Clinical Data: Fever.  Nausea and vomiting.  Pain in neck and head.  CT HEAD WITHOUT CONTRAST  Technique:  Contiguous axial images were obtained from the base of the skull through the vertex without contrast.  Comparison: None.  Findings: The brain stem, cerebellum, cerebral peduncles, thalami, basal ganglia, basilar cisterns, and ventricular system appear unremarkable.  No intracranial hemorrhage, mass lesion, or acute infarction is identified.  The basilar cisterns are patent.  There is minimal chronic right maxillary sinusitis.  IMPRESSION:  1.  Minimal chronic right maxillary sinusitis.   Otherwise, no significant abnormality identified.  Original Report Authenticated By: Dellia Cloud, M.D.     Assessment/Plan Present on Admission:   meningitis Admit to MedSurg  LP done it's in [facility], await results of culture and sensitivity Empiric antibiotics with Rocephin and vancomycin started Suspect viral etiology Antiemetics, antipruritics ordered  Bronchitis/pneumonia Already treated with antibiotics  duonebs  and Tessalon Perles ordered Hypothyroidism Resume home medication Tobacco abuse Nicotine patch   Full code DVT prophylaxis Team 9/Dr. Valinda Hoar, Myra Weng 01/24/2012, 7:42 PM

## 2012-01-24 NOTE — ED Notes (Signed)
Pt c/o persistent HA & low back pain since Thurs- was seen here [place] for same

## 2012-01-24 NOTE — ED Notes (Signed)
Report to Randa Evens, nurse for rm [phone] at [facility]

## 2012-01-24 NOTE — ED Provider Notes (Signed)
3:49 PM  I assumed care of patient from Dr Brooke Dare to f/u CSF results Pt is awake/alert, no distress Labs pending at this time  5:06 PM CSF concerning for meningitis Already received decadron/rocephin/vancomycin Pt awake/alert, GCS 15 Will transfer to [facility] D/w dr Lavera Guise, will accept in transfer   Joya Gaskins, MD 01/24/12 [phone]

## 2012-01-25 LAB — CBC
HCT: 39.5 % (ref 36.0–46.0)
Hemoglobin: 13.5 g/dL (ref 12.0–15.0)
MCH: 30.5 pg (ref 26.0–34.0)
MCV: 89.2 fL (ref 78.0–100.0)
RBC: 4.43 MIL/uL (ref 3.87–5.11)

## 2012-01-25 LAB — URINALYSIS, ROUTINE W REFLEX MICROSCOPIC
Bilirubin Urine: NEGATIVE
Ketones, ur: NEGATIVE mg/dL
Nitrite: NEGATIVE
Protein, ur: NEGATIVE mg/dL
Urobilinogen, UA: 0.2 mg/dL (ref 0.0–1.0)
pH: 6.5 (ref 5.0–8.0)

## 2012-01-25 LAB — CORTISOL-AM, BLOOD: Cortisol - AM: 0.7 ug/dL — ABNORMAL LOW (ref 4.3–22.4)

## 2012-01-25 LAB — BASIC METABOLIC PANEL
CO2: 25 mEq/L (ref 19–32)
Glucose, Bld: 108 mg/dL — ABNORMAL HIGH (ref 70–99)
Potassium: 3.8 mEq/L (ref 3.5–5.1)
Sodium: 137 mEq/L (ref 135–145)

## 2012-01-25 MED ORDER — SODIUM CHLORIDE 0.9 % IV BOLUS (SEPSIS)
500.0000 mL | Freq: Once | INTRAVENOUS | Status: AC
  Administered 2012-01-25: 500 mL via INTRAVENOUS

## 2012-01-25 MED ORDER — SODIUM CHLORIDE 0.9 % IV SOLN
INTRAVENOUS | Status: DC
  Administered 2012-01-25 (×2): via INTRAVENOUS
  Administered 2012-01-26: 125 mL/h via INTRAVENOUS
  Administered 2012-01-27 – 2012-01-28 (×2): via INTRAVENOUS

## 2012-01-25 MED ORDER — DEXAMETHASONE SODIUM PHOSPHATE 10 MG/ML IJ SOLN
10.0000 mg | Freq: Four times a day (QID) | INTRAMUSCULAR | Status: DC
  Administered 2012-01-25 (×3): 10 mg via INTRAVENOUS
  Filled 2012-01-25 (×12): qty 1

## 2012-01-25 MED ORDER — NICOTINE 14 MG/24HR TD PT24
14.0000 mg | MEDICATED_PATCH | Freq: Every day | TRANSDERMAL | Status: DC
  Filled 2012-01-25 (×5): qty 1

## 2012-01-25 NOTE — Progress Notes (Signed)
Repeat v/s at 11:15 86/47, HR 55, RR 16, sat 100%. Notified Dr Mahala Menghini via text paged, awaiting callback.

## 2012-01-25 NOTE — Progress Notes (Signed)
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  No n/v.  Some photophobia/neck pain.  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  She states all of her colleagues started to have Upper Resp symptoms, then developed symp similar to hers with Cough and cold-some developed some N/v but no diarrhea and she doesn't;t endorse any GI symtpoms   Objective   Interim History: Reviewed old notes-noted hypotensive today, even on repeat check  Objective: Filed Vitals:   01/24/12 1905 01/24/12 2048 01/25/12 0454 01/25/12 0832  BP: 104/54 118/53 88/45 91/26   Pulse: 59 69 74 68  Temp: 99.4 F (37.4 C) 99.6 F (37.6 C) 97 F (36.1 C) 97.8 F (36.6 C)  TempSrc: Oral Oral Oral Oral  Resp: 16 18 18 18   Height: 5\' 4"  (1.626 m)     Weight: 54.432 kg (120 lb)     SpO2: 98% 98% 97% 100%   No intake or output data in the 24 hours ending 01/25/12 0940  Exam:  General: alert pleasant cf in nad. Some neck stiffness, some pain on flexing knee to chest and dorsiflexion of foot.  Some paravertabral tenderness as well Cardiovascular: s1 s2 no m/r/g Respiratory: cta b, no added sound Abdomen: soft, nt/nd Skin no swelling Neuro Power normal.  EOMI, but painful to her when she looks to the outer lower quadrant on R side. Sensation grossly intact.  Data Reviewed: Basic Metabolic Panel:  Lab 01/25/12 8119 01/24/12 1335 01/22/12 2337  NA 137 135  134*  K 3.8 3.7 --  CL 105 102 99  CO2 25 25 24   GLUCOSE 108* 80 96  BUN 5* 5* 7  CREATININE 0.57 0.60 0.70  CALCIUM 8.2* 8.8 9.7  MG -- -- --  PHOS -- -- --   Liver Function Tests:  Lab 01/22/12 2337  AST 13  ALT 6  ALKPHOS 51  BILITOT 0.3  PROT 7.3  ALBUMIN 4.3    Lab 01/22/12 2337  LIPASE 24  AMYLASE --   No results found for this basename: AMMONIA:5 in the last 168 hours CBC:  Lab 01/25/12 0623 01/24/12 1335 01/22/12 2337  WBC 10.5 7.2 15.2*  NEUTROABS -- 4.8 12.6*  HGB 13.5 13.6 14.9  HCT 39.5 39.4 42.9  MCV 89.2 88.3 87.4  PLT 410* 434* 508*   Cardiac Enzymes: No results found for this basename: CKTOTAL:5,CKMB:5,CKMBINDEX:5,TROPONINI:5 in the last 168 hours BNP: No components found with this basename: POCBNP:5 CBG: No results found for this basename: GLUCAP:5 in the last 168 hours  Recent Results (from the past 240 hour(s))  GRAM STAIN     Status: Normal   Collection Time   01/24/12  2:00 PM      Component Value Range Status Comment   Specimen Description CSF   Final    Special Requests NONE   Final    Gram Stain     Final    Value: CYTOSPIN SLIDE  WBC PRESENT, PREDOMINANTLY MONONUCLEAR     NO ORGANISMS SEEN   Report Status 01/24/2012 FINAL   Final   CSF CULTURE     Status: Normal (Preliminary result)   Collection Time   01/24/12  2:13 PM      Component Value Range Status Comment   Specimen Description CSF   Final    Special Requests NONE   Final    Gram Stain     Final    Value: CYTOSPIN SLIDE WBC PRESENT, PREDOMINANTLY MONONUCLEAR     NO ORGANISMS SEEN     Performed at [facility]   Culture PENDING   Incomplete    Report Status PENDING   Incomplete      Studies:              All Imaging reviewed and is as per above notation   Scheduled Meds:   . cefTRIAXone (ROCEPHIN)  IV  2 g Intravenous Once  . cefTRIAXone (ROCEPHIN)  IV  2 g Intravenous Q12H  . dexamethasone  10 mg Intravenous Once  . diphenhydrAMINE  25 mg Intravenous  Once  .  HYDROmorphone (DILAUDID) injection  1 mg Intravenous Once  .  HYDROmorphone (DILAUDID) injection  1 mg Intravenous Once  . levothyroxine  125 mcg Oral QPM  . liothyronine  12.5 mcg Oral Daily  . metoCLOPramide (REGLAN) injection  10 mg Intravenous Once  . pneumococcal 23 valent vaccine  0.5 mL Intramuscular Tomorrow-1000  . sodium chloride  1,000 mL Intravenous Once  . vancomycin  750 mg Intravenous Q12H  . vancomycin  1,000 mg Intravenous Once   Continuous Infusions:   . sodium chloride       Assessment/Plan: 1. Likely Bacterial Meningitis-Patient had 2 episodes of possible Viral bronchitis which did not show up any specific findings on numerous cxr's, however her Protein count is 139, her glucose is 38 and she has a predominant WBC shift to 820, all pointing to bact meningitis. Add Decadron for 2 reasons 1) decreased mortality if used for Strep PNA of about 40%, 2)Hypothryoid on both t4,t3 therefore theoretical suppression of HPA, 3) Hypotensive with response to fluid. WiIl use Ceftriaxione and Rocephin.  Await CSF cultures and then barrow abx as needed-she has had viral meningitis in the past, but this seems less likely.  Get CBC and CMET am 2. Hypothyroid-on both t4,t3.  Will get a Baseline TSH.  See above discussion. 3. Hypotension-Likely related to volume issues.  Will watch closely.  Will need IVF short term.  Is not taking much po.  Close monitoring needed.  If has continued low BP's despite IVf, will need to change level of care.  Nursing aware of checks of Blood pressures.  Code Status: Full  Family Communication: none at bedside Disposition Plan: Inpatient   Pleas Koch, MD  [facility] Pager [phone] 01/25/2012, 9:40 AM    LOS: 1 day

## 2012-01-25 NOTE — Progress Notes (Signed)
 NS bolus given per MD orders, v/s repeated b/p 86/41, HR 65 RR 16, sat 99%, Dr Mahala Menghini informed. New orders received.  IV fluids continued. Pt AxOx3, c/o headached 5/10, photosensitivity. Denies n/v.

## 2012-01-25 NOTE — Progress Notes (Signed)
C/o headache 5/10, worsened by light and movement.  Nonproductive cough also worsens headache.  Bilat lungs CTA. C/o dizziness with activity, bedside commode in room within close reach, Advised pt to call for assistance with ambulation.  IV fluids started per MD orders. Pain med given.

## 2012-01-25 NOTE — Progress Notes (Signed)
[facility] follow-up progress note (same day) Patient still feeling poorly, in fact feeling worse than this am.  C/o Headache and malaise and being washed out. BP 94/42  Pulse 68  Temp(Src) 98.8 F (37.1 C) (Oral)  Resp 16  Ht 5\' 4"  (1.626 m)  Wt 54.432 kg (120 lb)  BMI 20.60 kg/m2  SpO2 99%  LMP 01/18/2012  EOMI, moves all 4 limbs equally Tored female CTa B NO Neuro signs changed from am.    Given her need for IV fluids and significant hypeternion out of the ordinary, will transfer transient;y to SDU for closer hemodynamic monitoring given no such capabilities on unit 5000.  Charge RN made aware also to get another IV saline lock.  Pleas Koch, MD [facility] [phone]

## 2012-01-25 NOTE — Progress Notes (Signed)
   CARE MANAGEMENT NOTE 01/25/2012  Patient:  Tracey Flowers, Tracey Flowers   Account Number:  [id number]  Date Initiated:  01/25/2012  Documentation initiated by:  Onnie Boer  Subjective/Objective Assessment:   PT WAS ADMITTED WITH MENINGITIS     Action/Plan:   PROGRESSION OF CARE AND DISCHARGE PLANNING   Anticipated DC Date:     Anticipated DC Plan:        DC Planning Services  CM consult      Choice offered to / List presented to:             Status of service:  In process, will continue to follow Medicare Important Message given?   (If response is "NO", the following Medicare IM given date fields will be blank) Date Medicare IM given:   Date Additional Medicare IM given:    Discharge Disposition:    Per UR Regulation:  Reviewed for med. necessity/level of care/duration of stay  If discussed at Long Length of Stay Meetings, dates discussed:    Comments:  01/25/12 Onnie Boer, RN, BSN 1233 UR COMPLETED

## 2012-01-26 ENCOUNTER — Encounter (HOSPITAL_COMMUNITY): Payer: Self-pay | Admitting: Family Medicine

## 2012-01-26 DIAGNOSIS — G039 Meningitis, unspecified: Secondary | ICD-10-CM

## 2012-01-26 MED ORDER — DIPHENHYDRAMINE HCL 12.5 MG/5ML PO ELIX
12.5000 mg | ORAL_SOLUTION | Freq: Four times a day (QID) | ORAL | Status: DC | PRN
  Administered 2012-01-26 – 2012-01-27 (×2): 12.5 mg via ORAL
  Filled 2012-01-26 (×2): qty 5

## 2012-01-26 NOTE — Progress Notes (Signed)
When dose of decadron pushed very slowly via iv tonight, pt c/o ofvery weird sensation in top of head and vagina area of tingling and burning, states this is the 3rd time she has experienced this and it makes her very uncomfortable for few seconds and then it goes away, pt states she does not want to take anymore until she discusses with doctor.[facility] [employer]

## 2012-01-26 NOTE — Consult Note (Signed)
Date of Admission:  01/24/2012  Date of Consult:  01/26/2012  Reason for Consult: Meningitis Referring Physician: Dr. Mahala Menghini   HPI: INESSA Flowers is an 51 y.o. female with past medical history significant for an episode of meningitis 25 years ago which was designated no was a long hospital. Note she states she was diagnosed by Dr. love that she is not certain whether she had bacterial or viral meningitis. She also has a history of post thyroid ablation hypothyroidism. The patient presents with a history approximately 2 weeks ago developing sinus congestion and symptoms of "a cold". She also had a cough which was largely nonproductive. She was seen by her primary care physician who gave her azithromycin in the form of a Z-Pak. Her cough and sinus congestion symptoms improved and she felt better. However after stopping the azithromycin she again began to feel less well with worsening cough and sinus congestion. She was then given a second round of azithromycin. She initially had improvement in her cough and sinus congestion but then began last Thursday to have severe headaches on the top of her head and is still stiff neck. She states she was seen at [facility] emergency department where she was offered a lumbar puncture. The physician there apparently told her they did not think she had bacterial meningitis but he wanted to get a lumbar puncture. Patient refused at that time. He then went how with narcotics and continuation of her azithromycin. Since then she developed worsening headache stiff neck and high fevers. She was seen [facility] and underwent lumbar puncture which revealed over 800 white blood cells on spinal fluid analysis with a lymphocytic predominance. Glucose was low in the 30s. Gram stains fail to reveal any organism and then has been isolated so far. She is given a dose of dexamethasone on "Sunday the seventh and started on Rocephin and  vancomycin in the emergency department and transferred to count for further care. After transfer to count she is had improvement in symptoms or headache. She did have low blood pressures at which required a transfer to the intensive care unit. Her dexamethasone was not continued on the entirety of the seventh but then restarted on the eighth. She has continued on vancomycin and Rocephin. As stated her headaches are much better her stiff neck is improved and she feels overall greatly improved. A shunt does take chronic nonsteroidals but has not recently increased her use of these drugs. She is not known to have genital herpes. She was Lasix reactive many years ago with her former husband but not recently. She does not know she's been tested for HIV.   Past Medical History  Diagnosis Date  . Hypotension   . Bronchitis   . Pneumonia   . Meningitis     01/2012    History reviewed. No pertinent past surgical history.ergies:   Allergies  Allergen Reactions  . Tetracyclines & Related Anaphylaxis  . Codeine Nausea And Vomiting     Medications: I have reviewed patients current medications as documented in Epic Anti-infectives     Start     Dose/Rate Route Frequency Ordered Stop   01/25/12 0400   vancomycin (VANCOCIN) 750 mg in sodium chloride 0.9 % 150 mL IVPB        750 mg 150 mL/hr over 60 Minutes Intravenous Every 12 hours 01/24/12 2218     04" /07/13 2215   cefTRIAXone (ROCEPHIN) 2 g in dextrose 5 %  50 mL IVPB        2 g 100 mL/hr over 30 Minutes Intravenous Every 12 hours 01/24/12 2207     01/24/12 1530   vancomycin (VANCOCIN) IVPB 1000 mg/200 mL premix        1,000 mg 200 mL/hr over 60 Minutes Intravenous  Once 01/24/12 1520 01/24/12 1741   01/24/12 1530   cefTRIAXone (ROCEPHIN) 2 g in dextrose 5 % 50 mL IVPB        2 g 100 mL/hr over 30 Minutes Intravenous  Once 01/24/12 1520 01/24/12 1621          Social History:  reports that she has been smoking Cigarettes.  She has a 7.5  pack-year smoking history. She does not have any smokeless tobacco history on file. She reports that she drinks alcohol. She reports that she does not use illicit drugs.  No family history on file.  As in HPI and primary teams notes otherwise 12 point review of systems is negative  Blood pressure 115/76, pulse 81, temperature 97.9 F (36.6 C), temperature source Oral, resp. rate 14, height 5\' 4"  (1.626 m), weight 120 lb (54.432 kg), last menstrual period 01/18/2012, SpO2 97.00%. General: Alert and awake, oriented x3, not in any acute distress. HEENT: anicteric sclera, pupils reactive to light and accommodation, EOMI, oropharynx clear and without exudate CVS regular rate, normal r,  no murmur rubs or gallops Chest: clear to auscultation bilaterally, no wheezing, rales or rhonchi Abdomen: soft nontender, nondistended, normal bowel sounds, Extremities: no  clubbing or edema noted bilaterally Skin: no rashes Neuro: nonfocal, strength and sensation intact, minimal stiff neck   Results for orders placed during the hospital encounter of 01/24/12 (from the past 48 hour(s))  CBC     Status: Abnormal   Collection Time   01/24/12  1:35 PM      Component Value Range Comment   WBC 7.2  4.0 - 10.5 (K/uL)    RBC 4.46  3.87 - 5.11 (MIL/uL)    Hemoglobin 13.6  12.0 - 15.0 (g/dL)    HCT 16.1  09.6 - 04.5 (%)    MCV 88.3  78.0 - 100.0 (fL)    MCH 30.5  26.0 - 34.0 (pg)    MCHC 34.5  30.0 - 36.0 (g/dL)    RDW 40.9  81.1 - 91.4 (%)    Platelets 434 (*) 150 - 400 (K/uL)   DIFFERENTIAL     Status: Normal   Collection Time   01/24/12  1:35 PM      Component Value Range Comment   Neutrophils Relative 66  43 - 77 (%)    Neutro Abs 4.8  1.7 - 7.7 (K/uL)    Lymphocytes Relative 19  12 - 46 (%)    Lymphs Abs 1.4  0.7 - 4.0 (K/uL)    Monocytes Relative 11  3 - 12 (%)    Monocytes Absolute 0.8  0.1 - 1.0 (K/uL)    Eosinophils Relative 3  0 - 5 (%)    Eosinophils Absolute 0.2  0.0 - 0.7 (K/uL)    Basophils  Relative 1  0 - 1 (%)    Basophils Absolute 0.1  0.0 - 0.1 (K/uL)   BASIC METABOLIC PANEL     Status: Abnormal   Collection Time   01/24/12  1:35 PM      Component Value Range Comment   Sodium 135  135 - 145 (mEq/L)    Potassium 3.7  3.5 - 5.1 (mEq/L)  Chloride 102  96 - 112 (mEq/L)    CO2 25  19 - 32 (mEq/L)    Glucose, Bld 80  70 - 99 (mg/dL)    BUN 5 (*) 6 - 23 (mg/dL)    Creatinine, Ser 8.11  0.50 - 1.10 (mg/dL)    Calcium 8.8  8.4 - 10.5 (mg/dL)    GFR calc non Af Amer >90  >90 (mL/min)    GFR calc Af Amer >90  >90 (mL/min)   CSF CELL COUNT WITH DIFFERENTIAL     Status: Abnormal   Collection Time   01/24/12  2:00 PM      Component Value Range Comment   Tube # 1      Color, CSF COLORLESS  COLORLESS     Appearance, CSF CLEAR  CLEAR     Supernatant NOT INDICATED      RBC Count, CSF 205 (*) 0 (/cu mm)    WBC, CSF 720 (*) 0 - 5 (/cu mm)    Segmented Neutrophils-CSF 2  0 - 6 (%)    Lymphs, CSF 96 (*) 40 - 80 (%)    Monocyte-Macrophage-Spinal Fluid 2 (*) 15 - 45 (%)   PROTEIN AND GLUCOSE, CSF     Status: Abnormal   Collection Time   01/24/12  2:00 PM      Component Value Range Comment   Glucose, CSF 38 (*) 43 - 76 (mg/dL)    Total  Protein, CSF 139 (*) 15 - 45 (mg/dL)   CSF CELL COUNT WITH DIFFERENTIAL     Status: Abnormal   Collection Time   01/24/12  2:00 PM      Component Value Range Comment   Tube # 4      Color, CSF COLORLESS  COLORLESS     Appearance, CSF CLEAR  CLEAR     Supernatant NOT INDICATED      RBC Count, CSF 1 (*) 0 (/cu mm)    WBC, CSF 820 (*) 0 - 5 (/cu mm)    Lymphs, CSF 98 (*) 40 - 80 (%)    Monocyte-Macrophage-Spinal Fluid 2 (*) 15 - 45 (%)   GRAM STAIN     Status: Normal   Collection Time   01/24/12  2:00 PM      Component Value Range Comment   Specimen Description CSF      Special Requests NONE      Gram Stain        Value: CYTOSPIN SLIDE     WBC PRESENT, PREDOMINANTLY MONONUCLEAR     NO ORGANISMS SEEN   Report Status 01/24/2012 FINAL       PATHOLOGIST SMEAR REVIEW     Status: Normal   Collection Time   01/24/12  2:00 PM      Component Value Range Comment   Tech Review Marked lymphocyte pleocytosis     CSF CULTURE     Status: Normal (Preliminary result)   Collection Time   01/24/12  2:13 PM      Component Value Range Comment   Specimen Description CSF      Special Requests NONE      Gram Stain        Value: CYTOSPIN SLIDE WBC PRESENT, PREDOMINANTLY MONONUCLEAR     NO ORGANISMS SEEN     Performed at [facility]   Culture NO GROWTH 1 DAY      Report Status PENDING     BASIC METABOLIC PANEL     Status: Abnormal  Collection Time   01/25/12  6:23 AM      Component Value Range Comment   Sodium 137  135 - 145 (mEq/L)    Potassium 3.8  3.5 - 5.1 (mEq/L)    Chloride 105  96 - 112 (mEq/L)    CO2 25  19 - 32 (mEq/L)    Glucose, Bld 108 (*) 70 - 99 (mg/dL)    BUN 5 (*) 6 - 23 (mg/dL)    Creatinine, Ser 4.09  0.50 - 1.10 (mg/dL)    Calcium 8.2 (*) 8.4 - 10.5 (mg/dL)    GFR calc non Af Amer >90  >90 (mL/min)    GFR calc Af Amer >90  >90 (mL/min)   CBC     Status: Abnormal   Collection Time   01/25/12  6:23 AM      Component Value Range Comment   WBC 10.5  4.0 - 10.5 (K/uL)    RBC 4.43  3.87 - 5.11 (MIL/uL)    Hemoglobin 13.5  12.0 - 15.0 (g/dL)    HCT 81.1  91.4 - 78.2 (%)    MCV 89.2  78.0 - 100.0 (fL)    MCH 30.5  26.0 - 34.0 (pg)    MCHC 34.2  30.0 - 36.0 (g/dL)    RDW 95.6  21.3 - 08.6 (%)    Platelets 410 (*) 150 - 400 (K/uL)   CULTURE, BLOOD (ROUTINE X 2)     Status: Normal (Preliminary result)   Collection Time   01/25/12  8:45 AM      Component Value Range Comment   Specimen Description BLOOD LEFT ARM      Special Requests BOTTLES DRAWN AEROBIC AND ANAEROBIC 10CC      Culture  Setup Time [id number]      Culture        Value:        BLOOD CULTURE RECEIVED NO GROWTH TO DATE CULTURE WILL BE HELD FOR 5 DAYS BEFORE ISSUING A FINAL NEGATIVE REPORT   Report Status PENDING     CULTURE, BLOOD (ROUTINE X 2)      Status: Normal (Preliminary result)   Collection Time   01/25/12  8:50 AM      Component Value Range Comment   Specimen Description BLOOD LEFT HAND      Special Requests BOTTLES DRAWN AEROBIC AND ANAEROBIC 5CC      Culture  Setup Time [id number]      Culture        Value:        BLOOD CULTURE RECEIVED NO GROWTH TO DATE CULTURE WILL BE HELD FOR 5 DAYS BEFORE ISSUING A FINAL NEGATIVE REPORT   Report Status PENDING     CORTISOL-AM, BLOOD     Status: Abnormal   Collection Time   01/25/12 10:29 AM      Component Value Range Comment   Cortisol - AM 0.7 (*) 4.3 - 22.4 (ug/dL)   TSH     Status: Abnormal   Collection Time   01/25/12 10:29 AM      Component Value Range Comment   TSH 34.062 (*) 0.350 - 4.500 (uIU/mL)   MRSA PCR SCREENING     Status: Normal   Collection Time   01/25/12  6:59 PM      Component Value Range Comment   MRSA by PCR NEGATIVE  NEGATIVE    URINALYSIS, ROUTINE W REFLEX MICROSCOPIC     Status: Abnormal   Collection Time   01/25/12 11:10 PM  Component Value Range Comment   Color, Urine YELLOW  YELLOW     APPearance HAZY (*) CLEAR     Specific Gravity, Urine 1.010  1.005 - 1.030     pH 6.5  5.0 - 8.0     Glucose, UA NEGATIVE  NEGATIVE (mg/dL)    Hgb urine dipstick NEGATIVE  NEGATIVE     Bilirubin Urine NEGATIVE  NEGATIVE     Ketones, ur NEGATIVE  NEGATIVE (mg/dL)    Protein, ur NEGATIVE  NEGATIVE (mg/dL)    Urobilinogen, UA 0.2  0.0 - 1.0 (mg/dL)    Nitrite NEGATIVE  NEGATIVE     Leukocytes, UA NEGATIVE  NEGATIVE  MICROSCOPIC NOT DONE ON URINES WITH NEGATIVE PROTEIN, BLOOD, LEUKOCYTES, NITRITE, OR GLUCOSE <1000 mg/dL.      Component Value Date/Time   SDES BLOOD LEFT HAND 01/25/2012 0850   SPECREQUEST BOTTLES DRAWN AEROBIC AND ANAEROBIC 5CC 01/25/2012 0850   CULT        BLOOD CULTURE RECEIVED NO GROWTH TO DATE CULTURE WILL BE HELD FOR 5 DAYS BEFORE ISSUING A FINAL NEGATIVE REPORT 01/25/2012 0850   REPTSTATUS PENDING 01/25/2012 0850   No results found.   Recent Results  (from the past 720 hour(s))  GRAM STAIN     Status: Normal   Collection Time   01/24/12  2:00 PM      Component Value Range Status Comment   Specimen Description CSF   Final    Special Requests NONE   Final    Gram Stain     Final    Value: CYTOSPIN SLIDE     WBC PRESENT, PREDOMINANTLY MONONUCLEAR     NO ORGANISMS SEEN   Report Status 01/24/2012 FINAL   Final   CSF CULTURE     Status: Normal (Preliminary result)   Collection Time   01/24/12  2:13 PM      Component Value Range Status Comment   Specimen Description CSF   Final    Special Requests NONE   Final    Gram Stain     Final    Value: CYTOSPIN SLIDE WBC PRESENT, PREDOMINANTLY MONONUCLEAR     NO ORGANISMS SEEN     Performed at [facility]   Culture NO GROWTH 1 DAY   Final    Report Status PENDING   Incomplete   CULTURE, BLOOD (ROUTINE X 2)     Status: Normal (Preliminary result)   Collection Time   01/25/12  8:45 AM      Component Value Range Status Comment   Specimen Description BLOOD LEFT ARM   Final    Special Requests BOTTLES DRAWN AEROBIC AND ANAEROBIC 10CC   Final    Culture  Setup Time [id number]   Final    Culture     Final    Value:        BLOOD CULTURE RECEIVED NO GROWTH TO DATE CULTURE WILL BE HELD FOR 5 DAYS BEFORE ISSUING A FINAL NEGATIVE REPORT   Report Status PENDING   Incomplete   CULTURE, BLOOD (ROUTINE X 2)     Status: Normal (Preliminary result)   Collection Time   01/25/12  8:50 AM      Component Value Range Status Comment   Specimen Description BLOOD LEFT HAND   Final    Special Requests BOTTLES DRAWN AEROBIC AND ANAEROBIC [facility]   Final    Culture  Setup Time [id number]   Final    Culture     Final  Value:        BLOOD CULTURE RECEIVED NO GROWTH TO DATE CULTURE WILL BE HELD FOR 5 DAYS BEFORE ISSUING A FINAL NEGATIVE REPORT   Report Status PENDING   Incomplete   MRSA PCR SCREENING     Status: Normal   Collection Time   01/25/12  6:59 PM      Component Value Range Status Comment   MRSA by PCR  NEGATIVE  NEGATIVE  Final      Impression/Recommendation 51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  #1Possible bacterial meningitis: I suspect that the azithromycin she was receiving partially treated her bacterial meningitis. I suspect that the azithromycin possibly may have sterilized the spinal fluid and also may have changed the spinal fluid profile to neutrophilic one to lymphocytic. Was axially upon partially treating this meningitis if this was in fact bacterial. Other possibilities could include herpes simplex type II meningitis, her clinical syndrome doesn't quite fit that. Other chronic forms of meningitis seem unlikely given the clinical picture.listeria  could also be considered given her CSF profile. However she is improved without optimal therapy for listeria and she lacks classic risk factors for this infection.  --I would continue the Rocephin at 2 g IV every 12 hours and complete a course of 2 weeks to treat presumptively pneumococcal meningitis and also cover for Haemophilus influenza --I would discontinue the vancomycin since there is not clear evidence for a stone resistant pneumococcus. --I would finish 4 days of therapy of dexamethasone if possible although one could argue about the efficacy of the steroid given that she had received antecedent azithromycin prior to initiation of the Decadron and her Decadron has not been given in a continuous manner since admission on the seventh. She has had intolerance to Decadron the past and certainly should this continue I would not hesitate to stop the Decadron. --I would screen her for HIV with HIV Theora Gianotti. --If there is CSF remaining one could check for herpes simplex 1 and 2 by PCR.  Thank you so much for this interesting consult,   Acey Lav 01/26/2012, 12:57 PM   [phone] (pager) [phone] (office)

## 2012-01-26 NOTE — Progress Notes (Signed)
PROGRESS NOTE  JANELI LEWISON ZOX:[id number] DOB: 27-Nov-1960 DOA: 01/24/2012 PCP: Lenora Boys, MD, MD  Brief narrative: 51 y/o CF admit 4/7 with likely meninigitis  Past medical history: Hypothyroidism on both Levothyroxine/Liothyronine, history of Viral meningitis in the past, htn  Consultants:  none  Procedures:  CXR 4/5=No Cardiopulmonary anomalies  CT scan 4/6=minimal chronic R maxillary sinusitis  LP done 4/6 CSF culture = cytospin slide WBC present, predominantly mononuclear, no organisms seen  Blood culture x2 pending, negative so far  Antibiotics:  Ceftriaxone 2g IV q12 hr  Vancomycin 750 IV q 12hr  Decadron 10 mg IV q6 4/8-->4/12   Subjective  Has a mild headache. Feels much better, but the Decadron has caused her to have facial swelling, Which is usual for her. She has had a history of back surgeries and epidural injections and this happened in She is willing to use steroids if she gets Benadryl as well. Headache is knowing there is bad as it She feels less washed out and feels a little bit better. Relates that her daughter had aseptic meningitis in January. Wants to know when she can go home.   Objective   Interim History:   Objective: Filed Vitals:   01/26/12 0200 01/26/12 0400 01/26/12 0435 01/26/12 0600  BP: 105/61 102/57 102/57 94/52  Pulse: 51 51 57 55  Temp:   97.2 F (36.2 C)   TempSrc:   Oral   Resp: 18 15 15 16   Height:      Weight:      SpO2: 96% 94% 95% 95%    Intake/Output Summary (Last 24 hours) at 01/26/12 0845 Last data filed at 01/26/12 0600  Gross per 24 hour  Intake   2310 ml  Output   1500 ml  Net    810 ml    Exam:  General: alert pleasant cf in nad. Neck stiffness seems to be decreased.  Sitting up in bed comfortably Cardiovascular: s1 s2 no m/r/g Respiratory: cta b, no added sound Abdomen: soft, nt/nd Skin no swelling Neuro Power normal.  EOMI, but painful to her when she looks to the outer lower  quadrant on R side. Sensation grossly intact.  Data Reviewed: Basic Metabolic Panel:  Lab 01/25/12 8119 01/24/12 1335 01/22/12 2337  NA 137 135 134*  K 3.8 3.7 --  CL 105 102 99  CO2 25 25 24   GLUCOSE 108* 80 96  BUN 5* 5* 7  CREATININE 0.57 0.60 0.70  CALCIUM 8.2* 8.8 9.7  MG -- -- --  PHOS -- -- --   Liver Function Tests:  Lab 01/22/12 2337  AST 13  ALT 6  ALKPHOS 51  BILITOT 0.3  PROT 7.3  ALBUMIN 4.3    Lab 01/22/12 2337  LIPASE 24  AMYLASE --   No results found for this basename: AMMONIA:5 in the last 168 hours CBC:  Lab 01/25/12 0623 01/24/12 1335 01/22/12 2337  WBC 10.5 7.2 15.2*  NEUTROABS -- 4.8 12.6*  HGB 13.5 13.6 14.9  HCT 39.5 39.4 42.9  MCV 89.2 88.3 87.4  PLT 410* 434* 508*   Cardiac Enzymes: No results found for this basename: CKTOTAL:5,CKMB:5,CKMBINDEX:5,TROPONINI:5 in the last 168 hours BNP: No components found with this basename: POCBNP:5 CBG: No results found for this basename: GLUCAP:5 in the last 168 hours  Recent Results (from the past 240 hour(s))  GRAM STAIN     Status: Normal   Collection Time   01/24/12  2:00 PM  Component Value Range Status Comment   Specimen Description CSF   Final    Special Requests NONE   Final    Gram Stain     Final    Value: CYTOSPIN SLIDE     WBC PRESENT, PREDOMINANTLY MONONUCLEAR     NO ORGANISMS SEEN   Report Status 01/24/2012 FINAL   Final   CSF CULTURE     Status: Normal (Preliminary result)   Collection Time   01/24/12  2:13 PM      Component Value Range Status Comment   Specimen Description CSF   Final    Special Requests NONE   Final    Gram Stain     Final    Value: CYTOSPIN SLIDE WBC PRESENT, PREDOMINANTLY MONONUCLEAR     NO ORGANISMS SEEN     Performed at [facility]   Culture NO GROWTH   Final    Report Status PENDING   Incomplete   CULTURE, BLOOD (ROUTINE X 2)     Status: Normal (Preliminary result)   Collection Time   01/25/12  8:45 AM      Component Value Range Status  Comment   Specimen Description BLOOD LEFT ARM   Final    Special Requests BOTTLES DRAWN AEROBIC AND ANAEROBIC 10CC   Final    Culture  Setup Time [id number]   Final    Culture     Final    Value:        BLOOD CULTURE RECEIVED NO GROWTH TO DATE CULTURE WILL BE HELD FOR 5 DAYS BEFORE ISSUING A FINAL NEGATIVE REPORT   Report Status PENDING   Incomplete   CULTURE, BLOOD (ROUTINE X 2)     Status: Normal (Preliminary result)   Collection Time   01/25/12  8:50 AM      Component Value Range Status Comment   Specimen Description BLOOD LEFT HAND   Final    Special Requests BOTTLES DRAWN AEROBIC AND ANAEROBIC 5CC   Final    Culture  Setup Time [id number]   Final    Culture     Final    Value:        BLOOD CULTURE RECEIVED NO GROWTH TO DATE CULTURE WILL BE HELD FOR 5 DAYS BEFORE ISSUING A FINAL NEGATIVE REPORT   Report Status PENDING   Incomplete   MRSA PCR SCREENING     Status: Normal   Collection Time   01/25/12  6:59 PM      Component Value Range Status Comment   MRSA by PCR NEGATIVE  NEGATIVE  Final      Studies:              All Imaging reviewed and is as per above notation   Scheduled Meds:    . cefTRIAXone (ROCEPHIN)  IV  2 g Intravenous Q12H  . dexamethasone  10 mg Intravenous Q6H  . levothyroxine  125 mcg Oral QPM  . liothyronine  12.5 mcg Oral Daily  . nicotine  14 mg Transdermal Daily  . pneumococcal 23 valent vaccine  0.5 mL Intramuscular Tomorrow-1000  . sodium chloride  500 mL Intravenous Once  . vancomycin  750 mg Intravenous Q12H   Continuous Infusions:    . sodium chloride 125 mL/hr (01/26/12 0330)     Assessment/Plan: 1. Likely Bacterial Meningitis-Patient had 2 episodes of possible Viral bronchitis which did not show up any specific findings on numerous cxr's, however her Protein count is 139, her glucose is 38 and she  has a predominant WBC shift to 820, all pointing to bact meningitis. Add Decadron for 2 reasons 1) decreased mortality if used for Strep PNA of  about 40%, 2)Hypothryoid on both t4,t3 therefore theoretical suppression of HPA, 3) Hypotensive with response to fluid. WiIl use Ceftriaxione and Rocephin.  Await CSF cultures and then barrow abx as needed-she has had viral meningitis in the past, but this seems less likely.  Get CBC = white count 10.5, platelets still elevated at 04/10 leukocytosis secondary to infection.  Will ask infectious disease to weigh in with regards to need for steroids and also need for possible IV antibiotics or if we can be de-escalate.  Spoke with Dr. Orvan Falconer who has graciously agreed to have one of his partners see the patient given the relative complex and mentions to keep everything on board until seen. 2. Hypothyroid-on both t4,t3.  Will get a Baseline TSH.  See above discussion. 3. Hypotension-Likely related to volume issues.  Better today. Continue IV fluids at around 125 cc per hour. If her blood pressures remain relatively stable could potentially transferred to step down but will see how she does and reevaluate during the course of today.  Code Status: Full  Family Communication: none at bedside Disposition Plan: Inpatient   Pleas Koch, MD  [facility] Pager [phone] 01/26/2012, 8:45 AM    LOS: 2 days

## 2012-01-27 DIAGNOSIS — I959 Hypotension, unspecified: Secondary | ICD-10-CM | POA: Diagnosis present

## 2012-01-27 DIAGNOSIS — R51 Headache: Secondary | ICD-10-CM | POA: Diagnosis present

## 2012-01-27 DIAGNOSIS — R519 Headache, unspecified: Secondary | ICD-10-CM | POA: Diagnosis present

## 2012-01-27 DIAGNOSIS — R110 Nausea: Secondary | ICD-10-CM | POA: Diagnosis present

## 2012-01-27 LAB — HIV ANTIBODY (ROUTINE TESTING W REFLEX): HIV: NONREACTIVE

## 2012-01-27 MED ORDER — ONDANSETRON HCL 4 MG/2ML IJ SOLN: 4.0000 mg | Freq: Four times a day (QID) | INTRAMUSCULAR | Status: DC | PRN

## 2012-01-27 MED ORDER — POLYETHYLENE GLYCOL 3350 17 G PO PACK
17.0000 g | PACK | Freq: Every day | ORAL | Status: DC
  Administered 2012-01-27: 17 g via ORAL
  Filled 2012-01-27 (×3): qty 1

## 2012-01-27 MED ORDER — ONDANSETRON HCL 4 MG PO TABS
4.0000 mg | ORAL_TABLET | ORAL | Status: DC | PRN
  Filled 2012-01-27: qty 1

## 2012-01-27 MED ORDER — DIPHENHYDRAMINE HCL 50 MG/ML IJ SOLN
12.5000 mg | Freq: Once | INTRAMUSCULAR | Status: AC
  Administered 2012-01-27: 12.5 mg via INTRAVENOUS
  Filled 2012-01-27: qty 0.25

## 2012-01-27 MED ORDER — PROMETHAZINE HCL 25 MG/ML IJ SOLN
12.5000 mg | Freq: Four times a day (QID) | INTRAMUSCULAR | Status: DC | PRN
  Administered 2012-01-27: 12.5 mg via INTRAVENOUS
  Filled 2012-01-27: qty 1

## 2012-01-27 MED ORDER — METHYLPREDNISOLONE SODIUM SUCC 125 MG IJ SOLR
80.0000 mg | Freq: Once | INTRAMUSCULAR | Status: AC
  Administered 2012-01-27: 80 mg via INTRAVENOUS
  Filled 2012-01-27: qty 1.28

## 2012-01-27 NOTE — Progress Notes (Signed)
[facility] [place] TEAM 1 - Stepdown/ICU TEAM  Subjective: 51 year old female with remote history of meningitis 25 years prior. Presented to the emergency department because of generalized malaise, severe headache, and nuchal rigidity. She endorses 2 weeks prior sinus congestion and upper respiratory infection and was treated by primary care physician with azithromycin. After that treatment her cough and sinus congestion improved but for some reason she stopped the azithromycin and began to feel sick again and then developed severe headaches and stiff neck. She was apparently evaluated at [facility] emergency department and was offered a lumbar puncture. She reports the physician there told her that he did not think she had bacterial meningitis but wanted to perform the test but the patient refused.she was represcribed azithromycin and narcotics for her headache. Unfortunately the headache and neck stiffness worsened and she subsequently presented to the [facility] center in [facility].at that point she consented to a lumbar puncture which revealed over 800 white blood cells in the CSF with a lymphocytic predominance. The glucose was also low in the 30s. Gram stain failed to reveal any organisms. She was given dexamethasone to help improve her headache symptoms. She was subsequently admitted to the hospital and was transferred to the step down unit 2 days ago because of hypotension. At admission she was started on vancomycin and Rocephin. Her headaches did improve with the addition of the dexamethasone as did the stiff neck. Subsequently infectious disease service has been consulted.  Today she endorses worsening headache and neck stiffness. She also informs [place] that she was refusing the IV Decadron because of side effects related to generalized burning and hot sensation administration of the drug. She has noticed this since the initial dose was given and the symptoms have worsened with  each subsequent dose.no other symptomatology indoors. Questions were answered regarding length of stay and possible need for IV antibiotics after discharge, as well as "how long will I have to be out of work."  She is also complaining of some mild shortness of breath but no chest pain.  Objective: Blood pressure 101/62, pulse 64, temperature 98.6 F (37 C), temperature source Oral, resp. rate 16, height 5\' 4"  (1.626 m), weight 54.7 kg (120 lb 9.5 oz), last menstrual period 01/18/2012, SpO2 100.00%.  General appearance: alert, cooperative, appears stated age and no distress Resp: clear to auscultation bilaterally Cardio: regular rate and rhythm, S1, S2 normal, no murmur, click, rub or gallop GI: soft, non-tender; bowel sounds normal; no masses,  no organomegaly Extremities: extremities normal, atraumatic, no cyanosis or edema Neurologic: Grossly normal  Lab Results:  Basename 01/25/12 0623 01/24/12 1335  WBC 10.5 7.2  HGB 13.5 13.6  HCT 39.5 39.4  PLT 410* 434*   BMET  Basename 01/25/12 0623 01/24/12 1335  NA 137 135  K 3.8 3.7  CL 105 102  CO2 25 25  GLUCOSE 108* 80  BUN 5* 5*  CREATININE 0.57 0.60  CALCIUM 8.2* 8.8    Medications: I have reviewed the patient's current medications.  Assessment/Plan:  Meningitis - ? partially treated bacterial *appreciate ID assistance *recommendations are to continue the Rocephin 2 g IV every 12 hours and complete a course of 2 weeks duration for presumptive pneumococcal meningitis. This would also cover Haemophilus influenza. Recommendations are to discontinue vancomycin *as a precaution testing for other infectious etiologies are also pending  Headache *headache and neck stiffness have worsened since the patient has been refusing the IV Decadron because of undesired side  effects *we will give a one-time dose of Solu-Medrol IV 80 mg and pretreat with 12.5 mg of IV Benadryl - if she tolerates this without any side effects can make this a  scheduled medication for another 48hrs   Nausea *Secondary to persistent headache and likely mild elevation in intracranial pressure *Will increase dosing frequency and Zofran and add low-dose Phenergan  Hypotension *Systolic blood pressure persistently in the low to mid 90 range and suspect this may be baseline for patient - she is very thin in appearance *decrease IV fluids from 125 cc per hour to 50 cc per hour especially since patient is complaining of some mild shortness of breath and she may be developing mild volume overload  Hypothyroidism *continue levothyroxine  Disposition Stable for transfer to Neuro floor   LOS: 3 days   Junious Silk, ANP pager [phone]  [facility] [email or web address] Password: [email or web address]  01/27/2012, 1:02 PM  I have personally examined this patient and reviewed the entire database. I have reviewed the above note, made any necessary editorial changes, and agree with its content.  Lonia Blood, MD [facility]

## 2012-01-27 NOTE — Progress Notes (Signed)
Order to transfer pt to Med surg.  Pt notified.  Report called to [facility] 3000, pt to go to [phone],  Belongings packed. Pt to transfer via WC.

## 2012-01-27 NOTE — Progress Notes (Signed)
Subjective: Pt with worsened HA and nausea overnight now better but worse than yesterday  Antibiotics:  Anti-infectives     Start     Dose/Rate Route Frequency Ordered Stop   01/25/12 0400   vancomycin (VANCOCIN) 750 mg in sodium chloride 0.9 % 150 mL IVPB  Status:  Discontinued        750 mg 150 mL/hr over 60 Minutes Intravenous Every 12 hours 01/24/12 2218 01/26/12 1307   01/24/12 2215   cefTRIAXone (ROCEPHIN) 2 g in dextrose 5 % 50 mL IVPB        2 g 100 mL/hr over 30 Minutes Intravenous Every 12 hours 01/24/12 2207     01/24/12 1530   vancomycin (VANCOCIN) IVPB 1000 mg/200 mL premix        1,000 mg 200 mL/hr over 60 Minutes Intravenous  Once 01/24/12 1520 01/24/12 1741   01/24/12 1530   cefTRIAXone (ROCEPHIN) 2 g in dextrose 5 % 50 mL IVPB        2 g 100 mL/hr over 30 Minutes Intravenous  Once 01/24/12 1520 01/24/12 1621          Medications: Scheduled Meds:   . cefTRIAXone (ROCEPHIN)  IV  2 g Intravenous Q12H  . methylPREDNISolone (SOLU-MEDROL) injection  80 mg Intravenous Once   And  . diphenhydrAMINE  12.5 mg Intravenous Once  . levothyroxine  125 mcg Oral QPM  . liothyronine  12.5 mcg Oral Daily  . nicotine  14 mg Transdermal Daily  . polyethylene glycol  17 g Oral Daily  . DISCONTD: dexamethasone  10 mg Intravenous Q6H   Continuous Infusions:   . sodium chloride 50 mL/hr at 01/27/12 0939   PRN Meds:.acetaminophen, acetaminophen, albuterol, alum & mag hydroxide-simeth, benzonatate, cyclobenzaprine, diphenhydrAMINE, HYDROcodone-acetaminophen, ipratropium, morphine, ondansetron (ZOFRAN) IV, ondansetron, promethazine, sodium chloride, traMADol, DISCONTD: ondansetron (ZOFRAN) IV, DISCONTD: ondansetron   Objective: Weight change:   Intake/Output Summary (Last 24 hours) at 01/27/12 1948 Last data filed at 01/27/12 1700  Gross per 24 hour  Intake   2655 ml  Output    700 ml  Net   1955 ml   Blood pressure 101/62, pulse 64, temperature 98.6 F (37 C),  temperature source Oral, resp. rate 16, height 5\' 4"  (1.626 m), weight 120 lb 9.5 oz (54.7 kg), last menstrual period 01/18/2012, SpO2 100.00%. Temp:  [98.1 F (36.7 C)-98.6 F (37 C)] 98.6 F (37 C) (04/10 1429) Pulse Rate:  [50-75] 64  (04/10 1429) Resp:  [14-20] 16  (04/10 1429) BP: (96-108)/(47-65) 101/62 mmHg (04/10 1429) SpO2:  [99 %-100 %] 100 % (04/10 1429) Weight:  [120 lb 9.5 oz (54.7 kg)] 120 lb 9.5 oz (54.7 kg) (04/10 0100)  Physical Exam: General: Alert and awake, oriented x3, not in any acute distress.  HEENT: anicteric sclera, pupils reactive to light and accommodation, EOMI, oropharynx clear and without exudate  CVS regular rate, normal r, no murmur rubs or gallops  Chest: clear to auscultation bilaterally, no wheezing, rales or rhonchi  Abdomen: soft nontender, nondistended, normal bowel sounds,  Extremities: no clubbing or edema noted bilaterally  Skin: no rashes  Neuro: nonfocal, strength and sensation intact, minimal stiff neck   Lab Results:  Basename 01/25/12 0623  WBC 10.5  HGB 13.5  HCT 39.5  PLT 410*    BMET  Basename 01/25/12 0623  NA 137  K 3.8  CL 105  CO2 25  GLUCOSE 108*  BUN 5*  CREATININE 0.57  CALCIUM 8.2*    Micro Results:  Recent Results (from the past 240 hour(s))  GRAM STAIN     Status: Normal   Collection Time   01/24/12  2:00 PM      Component Value Range Status Comment   Specimen Description CSF   Final    Special Requests NONE   Final    Gram Stain     Final    Value: CYTOSPIN SLIDE     WBC PRESENT, PREDOMINANTLY MONONUCLEAR     NO ORGANISMS SEEN   Report Status 01/24/2012 FINAL   Final   CSF CULTURE     Status: Normal (Preliminary result)   Collection Time   01/24/12  2:13 PM      Component Value Range Status Comment   Specimen Description CSF   Final    Special Requests NONE   Final    Gram Stain     Final    Value: CYTOSPIN SLIDE WBC PRESENT, PREDOMINANTLY MONONUCLEAR     NO ORGANISMS SEEN     Performed at [facility]   Culture NO GROWTH 2 DAYS   Final    Report Status PENDING   Incomplete   CULTURE, BLOOD (ROUTINE X 2)     Status: Normal (Preliminary result)   Collection Time   01/25/12  8:45 AM      Component Value Range Status Comment   Specimen Description BLOOD LEFT ARM   Final    Special Requests BOTTLES DRAWN AEROBIC AND ANAEROBIC 10CC   Final    Culture  Setup Time [id number]   Final    Culture     Final    Value:        BLOOD CULTURE RECEIVED NO GROWTH TO DATE CULTURE WILL BE HELD FOR 5 DAYS BEFORE ISSUING A FINAL NEGATIVE REPORT   Report Status PENDING   Incomplete   CULTURE, BLOOD (ROUTINE X 2)     Status: Normal (Preliminary result)   Collection Time   01/25/12  8:50 AM      Component Value Range Status Comment   Specimen Description BLOOD LEFT HAND   Final    Special Requests BOTTLES DRAWN AEROBIC AND ANAEROBIC 5CC   Final    Culture  Setup Time [id number]   Final    Culture     Final    Value:        BLOOD CULTURE RECEIVED NO GROWTH TO DATE CULTURE WILL BE HELD FOR 5 DAYS BEFORE ISSUING A FINAL NEGATIVE REPORT   Report Status PENDING   Incomplete   MRSA PCR SCREENING     Status: Normal   Collection Time   01/25/12  6:59 PM      Component Value Range Status Comment   MRSA by PCR NEGATIVE  NEGATIVE  Final     Studies/Results: No results found.    Assessment/Plan: Tracey Flowers is a 51 y.o. female with  with lymphocytic predominant bacterial meningitis.   #1Possible bacterial meningitis: I suspect that the azithromycin she was receiving partially treated her bacterial meningitis. I suspect that the azithromycin possibly may have sterilized the spinal fluid and also may have changed the spinal fluid profile to neutrophilic one to lymphocytic. Was axially upon partially treating this meningitis if this was in fact bacterial. Other possibilities could include herpes simplex type II meningitis, her clinical syndrome doesn't quite fit that. Other chronic forms of  meningitis seem unlikely given the clinical picture.listeria could also be considered given her CSF profile. However she is improved without  optimal therapy for listeria and she lacks classic risk factors for this infection. Her HA worsened overnight with DC of vancomycin and with her refusing the decadron, now on solumedrol   --if patients headaches continue to worsen in the am would consider repeat LP Vs broadening abx to cover Listeria with high dose ampicillin (note vancomycin can have some activity vs listeria and this could be one possible explanation for worsened headache overnight with withrdrawal of vancomycin --otherwise if improves would simply complete 2 week course of IV rocephin 2 g q 12 --I would continue the Rocephin at 2 g IV every 12 hours and complete a course of 2 weeks to treat presumptively pneumococcal meningitis and also cover for Haemophilus influenza  --would dc the steroids since she has so many issues with them and she already had received abx prior to steroids --HIV pending --add HSV PCR to CSF if available    LOS: 3 days   Acey Lav 01/27/2012, 7:48 PM

## 2012-01-27 NOTE — Progress Notes (Signed)
Pt refuses to take Decadron d/t an allergic reaction she has had to the med. States her eyes, arms, and legs have swollen and are red. Noted to have a rash on legs and chest and swollen ankles. Pt requesting MD to further talk with her about this med and its need. Will write note for MD to see in AM.

## 2012-01-28 DIAGNOSIS — E2740 Unspecified adrenocortical insufficiency: Secondary | ICD-10-CM | POA: Diagnosis present

## 2012-01-28 LAB — CSF CULTURE W GRAM STAIN

## 2012-01-28 LAB — CBC
MCH: 30 pg (ref 26.0–34.0)
MCV: 90.1 fL (ref 78.0–100.0)
Platelets: 437 10*3/uL — ABNORMAL HIGH (ref 150–400)
RDW: 15.3 % (ref 11.5–15.5)
WBC: 13.5 10*3/uL — ABNORMAL HIGH (ref 4.0–10.5)

## 2012-01-28 LAB — HERPES SIMPLEX VIRUS(HSV) DNA BY PCR
HSV 1 DNA: NOT DETECTED
HSV 2 DNA: DETECTED

## 2012-01-28 LAB — T4, FREE: Free T4: 1.1 ng/dL (ref 0.80–1.80)

## 2012-01-28 MED ORDER — COSYNTROPIN 0.25 MG IJ SOLR: 0.2500 mg | Freq: Once | INTRAMUSCULAR | Status: DC

## 2012-01-28 MED ORDER — SODIUM CHLORIDE 0.9 % IJ SOLN
10.0000 mL | INTRAMUSCULAR | Status: DC | PRN
  Administered 2012-01-29: 10 mL

## 2012-01-28 NOTE — Progress Notes (Signed)
Subjective: Pt better today concerned about her workup for adrenal insufficiency  Antibiotics:  Anti-infectives     Start     Dose/Rate Route Frequency Ordered Stop   01/25/12 0400   vancomycin (VANCOCIN) 750 mg in sodium chloride 0.9 % 150 mL IVPB  Status:  Discontinued        750 mg 150 mL/hr over 60 Minutes Intravenous Every 12 hours 01/24/12 2218 01/26/12 1307   01/24/12 2215   cefTRIAXone (ROCEPHIN) 2 g in dextrose 5 % 50 mL IVPB        2 g 100 mL/hr over 30 Minutes Intravenous Every 12 hours 01/24/12 2207     01/24/12 1530   vancomycin (VANCOCIN) IVPB 1000 mg/200 mL premix        1,000 mg 200 mL/hr over 60 Minutes Intravenous  Once 01/24/12 1520 01/24/12 1741   01/24/12 1530   cefTRIAXone (ROCEPHIN) 2 g in dextrose 5 % 50 mL IVPB        2 g 100 mL/hr over 30 Minutes Intravenous  Once 01/24/12 1520 01/24/12 1621          Medications: Scheduled Meds:    . cefTRIAXone (ROCEPHIN)  IV  2 g Intravenous Q12H  . levothyroxine  125 mcg Oral QPM  . liothyronine  12.5 mcg Oral Daily  . nicotine  14 mg Transdermal Daily  . polyethylene glycol  17 g Oral Daily  . DISCONTD: cosyntropin  0.25 mg Intravenous Once   Continuous Infusions:    . sodium chloride 50 mL/hr at 01/28/12 0140   PRN Meds:.acetaminophen, acetaminophen, albuterol, alum & mag hydroxide-simeth, benzonatate, cyclobenzaprine, diphenhydrAMINE, HYDROcodone-acetaminophen, ipratropium, morphine, ondansetron (ZOFRAN) IV, ondansetron, promethazine, sodium chloride, traMADol   Objective: Weight change:   Intake/Output Summary (Last 24 hours) at 01/28/12 1311 Last data filed at 01/27/12 2300  Gross per 24 hour  Intake    400 ml  Output      0 ml  Net    400 ml   Blood pressure 118/61, pulse 70, temperature 97.8 F (36.6 C), temperature source Oral, resp. rate 18, height 5\' 4"  (1.626 m), weight 120 lb 9.5 oz (54.7 kg), last menstrual period 01/18/2012, SpO2 100.00%. Temp:  [97.4 F (36.3 C)-98.6 F (37 C)]  97.8 F (36.6 C) (04/11 1113) Pulse Rate:  [56-70] 70  (04/11 1113) Resp:  [16-20] 18  (04/11 1113) BP: (84-118)/(50-67) 118/61 mmHg (04/11 1113) SpO2:  [96 %-100 %] 100 % (04/11 1113)  Physical Exam: General: Alert and awake, oriented x3, not in any acute distress.  HEENT: anicteric sclera, pupils reactive to light and accommodation, EOMI, oropharynx clear and without exudate  CVS regular rate, normal r, no murmur rubs or gallops  Chest: clear to auscultation bilaterally, no wheezing, rales or rhonchi  Abdomen: soft nontender, nondistended, normal bowel sounds,  Extremities: no clubbing or edema noted bilaterally  Skin: no rashes  Neuro: nonfocal, strength and sensation intact, minimal stiff neck   Lab Results:  Basename 01/28/12 0645  WBC 13.5*  HGB 13.4  HCT 40.2  PLT 437*    BMET No results found for this basename: NA:2,K:2,CL:2,CO2:2,GLUCOSE:2,BUN:2,CREATININE:2,CALCIUM:2 in the last 72 hours  Micro Results: Recent Results (from the past 240 hour(s))  GRAM STAIN     Status: Normal   Collection Time   01/24/12  2:00 PM      Component Value Range Status Comment   Specimen Description CSF   Final    Special Requests NONE   Final    Gram Stain  Final    Value: CYTOSPIN SLIDE     WBC PRESENT, PREDOMINANTLY MONONUCLEAR     NO ORGANISMS SEEN   Report Status 01/24/2012 FINAL   Final   CSF CULTURE     Status: Normal   Collection Time   01/24/12  2:13 PM      Component Value Range Status Comment   Specimen Description CSF   Final    Special Requests NONE   Final    Gram Stain     Final    Value: CYTOSPIN SLIDE WBC PRESENT, PREDOMINANTLY MONONUCLEAR     NO ORGANISMS SEEN     Performed at [facility]   Culture NO GROWTH 3 DAYS   Final    Report Status 01/28/2012 FINAL   Final   CULTURE, BLOOD (ROUTINE X 2)     Status: Normal (Preliminary result)   Collection Time   01/25/12  8:45 AM      Component Value Range Status Comment   Specimen Description BLOOD LEFT  ARM   Final    Special Requests BOTTLES DRAWN AEROBIC AND ANAEROBIC 10CC   Final    Culture  Setup Time [id number]   Final    Culture     Final    Value:        BLOOD CULTURE RECEIVED NO GROWTH TO DATE CULTURE WILL BE HELD FOR 5 DAYS BEFORE ISSUING A FINAL NEGATIVE REPORT   Report Status PENDING   Incomplete   CULTURE, BLOOD (ROUTINE X 2)     Status: Normal (Preliminary result)   Collection Time   01/25/12  8:50 AM      Component Value Range Status Comment   Specimen Description BLOOD LEFT HAND   Final    Special Requests BOTTLES DRAWN AEROBIC AND ANAEROBIC 5CC   Final    Culture  Setup Time [id number]   Final    Culture     Final    Value:        BLOOD CULTURE RECEIVED NO GROWTH TO DATE CULTURE WILL BE HELD FOR 5 DAYS BEFORE ISSUING A FINAL NEGATIVE REPORT   Report Status PENDING   Incomplete   MRSA PCR SCREENING     Status: Normal   Collection Time   01/25/12  6:59 PM      Component Value Range Status Comment   MRSA by PCR NEGATIVE  NEGATIVE  Final     Studies/Results: No results found.    Assessment/Plan: Tracey Flowers is a 51 y.o. female with  with lymphocytic predominant bacterial meningitis.   #1Possible bacterial meningitis: I suspect that the azithromycin she was receiving partially treated her bacterial meningitis. I suspect that the azithromycin possibly may have sterilized the spinal fluid and also may have changed the spinal fluid profile to neutrophilic one to lymphocytic. Was axially upon partially treating this meningitis if this was in fact bacterial. Other possibilities could include herpes simplex type II meningitis, her clinical syndrome doesn't quite fit that. Other chronic forms of meningitis seem unlikely given the clinical picture.listeria could also be considered given her CSF profile. However she is improved without optimal therapy for listeria and she lacks classic risk factors for this infection. HA better today  --finish 2 weeks of high dose  rocephin --HIV negative -fu HSV pcrs  #2 ? Adrenal insufficiency:  I wonder if her dexamethasone given on the 7th could have suppressed her am cortisol level the following day. Difficult to interpret this result. Will defer to primary  team  I spent greater than 45 minutes with the patient including greater than 50% of time in face to face counsel of the patient and in coordination of their care.   I will sign off for now Please call with further questions    LOS: 4 days   Acey Lav 01/28/2012, 1:11 PM

## 2012-01-28 NOTE — Progress Notes (Signed)
[facility] [place] TEAM 1 - Stepdown/ICU TEAM  Subjective: 51 year old female with remote history of meningitis 25 years prior. Presented to the emergency department because of generalized malaise, severe headache, and nuchal rigidity. She endorses 2 weeks prior sinus congestion and upper respiratory infection and was treated by primary care physician with azithromycin. After that treatment her cough and sinus congestion improved but for some reason she stopped the azithromycin and began to feel sick again and then developed severe headaches and stiff neck. She was apparently evaluated at [facility] emergency department and was offered a lumbar puncture. She reports the physician there told her that he did not think she had bacterial meningitis but wanted to perform the test but the patient refused.she was represcribed azithromycin and narcotics for her headache. Unfortunately the headache and neck stiffness worsened and she subsequently presented to the [facility] Med center in [facility].at that point she consented to a lumbar puncture which revealed over 800 white blood cells in the CSF with a lymphocytic predominance. The glucose was also low in the 30s. Gram stain failed to reveal any organisms. She was given dexamethasone to help improve her headache symptoms. She was subsequently admitted to the hospital and was transferred to the step down unit 2 days ago because of hypotension. At admission she was started on vancomycin and Rocephin. Her headaches did improve with the addition of the dexamethasone as did the stiff neck. Subsequently infectious disease service has been consulted.  Today she endorses feeling much better although still has persistent myalgias. States headache resolved after one time dose of Solu-Medrol and Benadryl.discussed with patient rationale and plans for workup for suspected adrenal insufficiency related to extremely low random cortisol  level.  Objective: Blood pressure 118/61, pulse 70, temperature 97.8 F (36.6 C), temperature source Oral, resp. rate 18, height 5\' 4"  (1.626 m), weight 54.7 kg (120 lb 9.5 oz), last menstrual period 01/18/2012, SpO2 100.00%.  General appearance: alert, cooperative, appears stated age and no distress Resp: clear to auscultation bilaterally Cardio: regular rate and rhythm, S1, S2 normal, no murmur, click, rub or gallop GI: soft, non-tender; bowel sounds normal; no masses,  no organomegaly Extremities: extremities normal, atraumatic, no cyanosis or edema Neurologic: Grossly normal  Lab Results:  Basename 01/28/12 0645  WBC 13.5*  HGB 13.4  HCT 40.2  PLT 437*   BMET No results found for this basename: NA:2,K:2,CL:2,CO2:2,GLUCOSE:2,BUN:2,CREATININE:2,CALCIUM:2 in the last 72 hours  Medications: I have reviewed the patient's current medications.  Assessment/Plan:  Meningitis - ? partially treated bacterial *appreciate ID assistance *recommendations are to continue the Rocephin 2 g IV every 12 hours and complete a course of 2 weeks duration for presumptive pneumococcal meningitis. This would also cover Haemophilus influenza. Recommendations are to discontinue vancomycin *as a precaution testing for other infectious etiologies are also pending *have PICC line inserted today  Headache *has now resolved *she has a one-time dose of Solu-Medrol IV 80 mg and pretreat with 12.5 mg of IV Benadryl with excellent results but because of the underlying adrenal insufficiency with workup in process we'll hold off on adding steroids at this point  Nausea *resolved *Secondary to persistent headache and likely mild elevation in intracranial pressure *continue Zofran and low-dose Phenergan  Hypotension/suspected adrenal insufficiency *Systolic blood pressure persistently in the low to mid 90 range and suspect this may be baseline for patient - she is very thin in appearance-she endorses lifelong  low blood pressure *random cortisol quite low at 0.7 therefore we'll  check ACTH level. * we'll also repeat another random cortisol level to make sure initial result accurate *pending workup will likely need long-term replacement of cortisol with steroids * differential includes Addison's disease, ACTH insufficiency, or Panhypopituitarism given current difficulty in controlling hypothyroidism * may benefit from contrasted MRI of the brain to look for pituitary lesions. Please note that CT of the head done regarding her meningitis was done without contrast and may have not been able to define intracranial lesion  Hypothyroidism *TSH elevated at 34. We'll check free T4 and T3 to help clarify. Could be an indicator of hypopituitarism issue * patient worsens readily follows her primary care physician and recently had TSH checked and reports this was normal *continue levothyroxine at current dose for now  Disposition *Remain on Neuro floor *hopefully can discharge in the next 24-48 hours once adrenal insufficiency issue better clarified   LOS: 4 days   Junious Silk, ANP pager [phone]  [facility] [email or web address] Password: [email or web address]  01/28/2012, 11:41 AM   I have examined the patient, reviewed the chart and agree with the above note.   Calvert Cantor, MD [phone]

## 2012-01-28 NOTE — Progress Notes (Signed)
   CARE MANAGEMENT NOTE 01/28/2012  Patient:  Tracey Flowers, Tracey Flowers   Account Number:  [id number]  Date Initiated:  01/25/2012  Documentation initiated by:  Onnie Boer  Subjective/Objective Assessment:   PT WAS ADMITTED WITH MENINGITIS     Action/Plan:   PROGRESSION OF CARE AND DISCHARGE PLANNING   Anticipated DC Date:  01/29/2012   Anticipated DC Plan:  HOME W HOME HEALTH SERVICES      DC Planning Services  CM consult      [facility] Choice  HOME HEALTH   Choice offered to / List presented to:  C-1 Patient        HH arranged  HH-1 RN      [facility] agency  [facility]   Status of service:  In process, will continue to follow Medicare Important Message given?   (If response is "NO", the following Medicare IM given date fields will be blank) Date Medicare IM given:   Date Additional Medicare IM given:    Discharge Disposition:    Per UR Regulation:  Reviewed for med. necessity/level of care/duration of stay  If discussed at Long Length of Stay Meetings, dates discussed:    Comments:  01/28/12 15:28 Letha Cape RN, BSN [phone] patient lives with two daughters, patient has medication coverage and transportation.  Patient would like to work with [facility] for iv abx, will need rocephin 2mg  iv q12 thru 4/18, then pull picl after last dose , weekly cbc, bnp and fax to Dr. Algis Liming at [phone].  Referral made to [facility], [facility] notified.  Soc will begin 24-48 hrs post discharge. Patient to receive picc line today and for dc tomorrow.   01/25/12 Onnie Boer, RN, BSN 1233 UR COMPLETED

## 2012-01-29 LAB — CBC
HCT: 36.2 % (ref 36.0–46.0)
Hemoglobin: 12.3 g/dL (ref 12.0–15.0)
MCH: 30.4 pg (ref 26.0–34.0)
MCHC: 34 g/dL (ref 30.0–36.0)
MCV: 89.4 fL (ref 78.0–100.0)

## 2012-01-29 LAB — BASIC METABOLIC PANEL
BUN: 7 mg/dL (ref 6–23)
Calcium: 8.6 mg/dL (ref 8.4–10.5)
Creatinine, Ser: 0.64 mg/dL (ref 0.50–1.10)
GFR calc non Af Amer: >90 mL/min (ref >90)
Glucose, Bld: 72 mg/dL (ref 70–99)

## 2012-01-29 MED ORDER — OXYCODONE HCL 5 MG PO TABS
5.0000 mg | ORAL_TABLET | ORAL | Status: DC | PRN
  Administered 2012-01-29: 5 mg via ORAL
  Filled 2012-01-29: qty 1

## 2012-01-29 MED ORDER — DEXTROSE 5 % IV SOLN: 2.0000 g | Freq: Two times a day (BID) | INTRAVENOUS | Status: DC

## 2012-01-29 MED ORDER — PREDNISONE 5 MG PO TABS
5.0000 mg | ORAL_TABLET | Freq: Every day | ORAL | Status: DC
  Administered 2012-01-29: 5 mg via ORAL
  Filled 2012-01-29 (×2): qty 1

## 2012-01-29 MED ORDER — OXYCODONE HCL 5 MG PO TABS: 5.0000 mg | ORAL_TABLET | ORAL | Status: AC | PRN

## 2012-01-29 MED ORDER — TRAMADOL HCL 50 MG PO TABS: 50.0000 mg | ORAL_TABLET | Freq: Four times a day (QID) | ORAL | Status: DC | PRN

## 2012-01-29 MED ORDER — PREDNISONE 5 MG PO TABS: 5.0000 mg | ORAL_TABLET | Freq: Every day | ORAL | Status: AC

## 2012-01-29 NOTE — Discharge Summary (Signed)
DISCHARGE SUMMARY  Tracey Flowers  MR#: [id number]  DOB:1961/05/23  Date of Admission: 01/24/2012 Date of Discharge: 01/29/2012  Attending Physician:Melanie Openshaw Butler Denmark, MD  Patient's YQM:VHQIO, Tracey Cheadle, MD, MD  Consults: Paulette [place] -  Infectious Disease  Pertinent discharge information: *Being treated for presumed bacterial meningitis with IV Rocephin. To complete additional 10 days of therapy after discharge. Has PICC line in place right upper extremity  *During the hospitalization found to have low serum cortisol level and empirically being treated with oral prednisone. Has followup appointment scheduled with endocrinologist after discharge.  *Also found to have elevated TSH with slightly low T3 this admission but preadmission levothyroxine dosage not changed here  Discharge Diagnoses: Principal Problem:  *suspected bacterial Meningitis Active Problems:  Adrenal insufficiency  Headache  Nausea in adult  Hypotension  Hypothyroidism   Radiology: Dg Chest 2 View  01/22/2012  *RADIOLOGY REPORT*  Clinical Data: Fever, back pain  CHEST - 2 VIEW  Comparison: 01/12/2012  Findings: Lungs are essentially clear. No pleural effusion or pneumothorax.  Nodular opacities overlying the bilateral lower lobes reflect nipple shadows.  The heart is normal in size.  Visualized osseous structures are within normal limits.  IMPRESSION: No evidence of acute cardiopulmonary disease.  Original Report Authenticated By: Charline Bills, M.D.   Ct Head Wo Contrast  01/23/2012  *RADIOLOGY REPORT*  Clinical Data: Fever.  Nausea and vomiting.  Pain in neck and head.  CT HEAD WITHOUT CONTRAST  Technique:  Contiguous axial images were obtained from the base of the skull through the vertex without contrast.  Comparison: None.  Findings: The brain stem, cerebellum, cerebral peduncles, thalami, basal ganglia, basilar cisterns, and ventricular system appear unremarkable.  No intracranial hemorrhage, mass lesion,  or acute infarction is identified.  The basilar cisterns are patent.  There is minimal chronic right maxillary sinusitis.  IMPRESSION:  1.  Minimal chronic right maxillary sinusitis.   Otherwise, no significant abnormality identified.  Original Report Authenticated By: Dellia Cloud, M.D.    Laboratory: Results for orders placed during the hospital encounter of 01/24/12 (from the past 48 hour(s))  CBC     Status: Abnormal   Collection Time   01/28/12  6:45 AM      Component Value Range Comment   WBC 13.5 (*) 4.0 - 10.5 (K/uL)    RBC 4.46  3.87 - 5.11 (MIL/uL)    Hemoglobin 13.4  12.0 - 15.0 (g/dL)    HCT 96.2  95.2 - 84.1 (%)    MCV 90.1  78.0 - 100.0 (fL)    MCH 30.0  26.0 - 34.0 (pg)    MCHC 33.3  30.0 - 36.0 (g/dL)    RDW 32.4  40.1 - 02.7 (%)    Platelets 437 (*) 150 - 400 (K/uL)   CORTISOL     Status: Normal   Collection Time   01/28/12  9:21 AM      Component Value Range Comment   Cortisol, Plasma 2.4     T3     Status: Abnormal   Collection Time   01/28/12  9:22 AM      Component Value Range Comment   T3, Total 44.7 (*) 80.0 - 204.0 (ng/dl)   T4, FREE     Status: Normal   Collection Time   01/28/12  9:22 AM      Component Value Range Comment   Free T4 1.10  0.80 - 1.80 (ng/dL)   CBC     Status: Normal  Collection Time   01/29/12  5:10 AM      Component Value Range Comment   WBC 8.2  4.0 - 10.5 (K/uL)    RBC 4.05  3.87 - 5.11 (MIL/uL)    Hemoglobin 12.3  12.0 - 15.0 (g/dL)    HCT 16.1  09.6 - 04.5 (%)    MCV 89.4  78.0 - 100.0 (fL)    MCH 30.4  26.0 - 34.0 (pg)    MCHC 34.0  30.0 - 36.0 (g/dL)    RDW 40.9  81.1 - 91.4 (%)    Platelets 381  150 - 400 (K/uL)   BASIC METABOLIC PANEL     Status: Normal   Collection Time   01/29/12  5:10 AM      Component Value Range Comment   Sodium 136  135 - 145 (mEq/L)    Potassium 4.1  3.5 - 5.1 (mEq/L)    Chloride 102  96 - 112 (mEq/L)    CO2 28  19 - 32 (mEq/L)    Glucose, Bld 72  70 - 99 (mg/dL)    BUN 7  6 - 23  (mg/dL)    Creatinine, Ser 7.82  0.50 - 1.10 (mg/dL)    Calcium 8.6  8.4 - 10.5 (mg/dL)    GFR calc non Af Amer >90  >90 (mL/min)    GFR calc Af Amer >90  >90 (mL/min)      Medication List  As of 01/29/2012 11:29 AM   STOP taking these medications         azithromycin 250 MG tablet      naproxen sodium 220 MG tablet         TAKE these medications         cyclobenzaprine 5 MG tablet   Commonly known as: FLEXERIL   Take 5 mg by mouth 3 (three) times daily as needed. For muscle spasms      dextrose 5 % SOLN 50 mL with cefTRIAXone 2 G SOLR 2 g   Inject 2 g into the vein every 12 (twelve) hours.      levothyroxine 125 MCG tablet   Commonly known as: SYNTHROID, LEVOTHROID   Take 125 mcg by mouth every evening.      liothyronine 25 MCG tablet   Commonly known as: CYTOMEL   Take 12.5 mcg by mouth daily.      oxyCODONE 5 MG immediate release tablet   Commonly known as: Oxy IR/ROXICODONE   Take 1 tablet (5 mg total) by mouth every 4 (four) hours as needed.      predniSONE 5 MG tablet   Commonly known as: DELTASONE   Take 1 tablet (5 mg total) by mouth daily with breakfast.      traMADol 50 MG tablet   Commonly known as: ULTRAM   Take 50 mg by mouth every 6 (six) hours as needed. For pain      Vitamin D 2000 UNITS tablet   Take 2,000 Units by mouth daily.            History of present illness: 51 year old female who presented with severe headache. Imdur 34 week history of upper respiratory infection treated by primary care physician with antibiotics. Developed headache and nuchal pain and initially sought care at [facility]. Concerns for possible meningitis but at that time patient elected to proceed with lumbar puncture procedure. Was again treated with antibiotics and given narcotics for pain control. Unfortunately symptoms worsened and she presented to  [facility] where viral versus bacterial meningitis was suspected. She  was subsequently transferred to [facility] where a lumbar puncture was performed which was concerning for meningitis therefore she was admitted to the hospital. In the emergency department her vital signs were stable and she only had low-grade temperature of 99.4. Physical exam was unremarkable except for complaints of headache, nuchal pain. Her electrolyte panel was normal and her white count was normal. Analysis of cerebrospinal fluid from lumbar puncture revealed WBCs greater than a 20 with elevated total protein 139 and low serum glucose of 38. CT of the head without contrast showed no significant abnormalities only minimal chronic right maxillary sinusitis. A two-view chest x-ray was also completed and shows no acute process.  Hospital Course: Principal Problem:  *suspected bacterial Meningitis Active Problems:  Adrenal insufficiency  Headache  Nausea in adult  Hypotension  Hypothyroidism   Meningitis-likely partially treated bacterial etiology Patient was admitted to the general medical floor where she was started on broad-spectrum metabolic therapy to include Rocephin 2 g IV every 12 hours as well as vancomycin. Infectious disease service was consulted and based on their evaluation they felt that this was likely partially treated bacterial meningitis with some of the results skewed by the fact patient has had several doses of outpatient antibiotics prior to admission. Recommendation at this point is to continue the Rocephin for a total of 14 days of therapy. Vancomycin was discontinued. A PICC line was also inserted on 01/28/2012 so the patient to complete outpatient intravenous antibiotic therapy.  Headache Headaches were felt to be primarily related to the acute meningitis with associated suspected mild increases in intracranial pressure. She was treated with narcotic pain medications as well as IV Decadron. Decadron was discontinued at the patient's request due to undesired side  effects. She endorsed a sensation of diffuse burning throughout her entire body after menstruation each medication. 48 hours prior to discharge as an alternative to Decadron a one-time dose of Solu-Medrol 80 mg IV with a pretreatment dose of 12.5 mg of Benadryl IV was given. The patient tolerated this medication without similar symptomatology and her headache also greatly decreased in severity. She endorses a history of intolerance to narcotics with excessive lethargy and nausea so we have resumed her tramadol she was taking prior to admission we've also offered her a prescription of plain oxycodone to use if headache is not adequately treated with Ultram alone.  Hypotension secondary to suspected adrenal insufficiency During the hospitalization the patient was determined to have systolic blood pressures ranging between 80-95. She endorses lifelong history with low blood pressure. Because of low blood pressure she was transferred to the step down ICU and aggressive IV fluid rehydration was begun but pressure only minimally responded. As a precaution stat serum cortisol level was checked and was very low at 0.7. This was obtained on 01/25/2012. A repeat serum cortisol level was obtained on 01/28/2012 and although higher still was low at 2.4 prompting consideration of primary adrenal insufficiency. At time of dictation an ACTH level was pending. The case was discussed over the telephone with Dr. Romero Belling who will be following the patient after discharge-an appointment has been scheduled. He recommended to begin the patient on steroid replacement therapy with prednisone 5 mg daily and he will complete the workup after discharge. Noncontrasted CT of the head done in admission shows no evidence of intracranial lesion but this study may be limited since there was no contrast utilized.  Hypothyroidism Patient has a long-standing history of hypothyroidism and endorses within the past 3 months has had a TSH checked  and reports this was within normal limits and no adjustments in medications were made preadmission. During the hospitalization a TSH was checked and was elevated at 34. A free T4 was normal but T3 was slightly low at 44 which appears to be indicative of some difficulty in sorting T4 to be usable form with the use of medications. We did not increase the patient's medications and will defer this to either her primary care physician or the endocrinologist who she will be seen for the adrenal insufficiency issues  Day of Discharge BP 110/69  Pulse 60  Temp(Src) 97.5 F (36.4 C) (Oral)  Resp 20  Ht 5\' 4"  (1.626 m)  Wt 54.7 kg (120 lb 9.5 oz)  BMI 20.70 kg/m2  SpO2 95%  LMP 01/18/2012  Physical Exam:  General appearance: alert, cooperative, appears stated age and no distress Resp: clear to auscultation bilaterally Cardio: regular rate and rhythm, S1, S2 normal, no murmur, click, rub or gallop GI: soft, non-tender; bowel sounds normal; no masses,  no organomegaly Extremities: extremities normal, atraumatic, no cyanosis or edema Neurologic: Grossly normal  Follow-up: She is to call her primary care physician to be seen in next 1-2 weeks regarding routine hospital follow up  She has an appointment scheduled with Dr. Romero Belling on 02/15/2012 at 1:30 PM regarding outpatient workup suspected adrenal insufficiency   Disposition: Discharge to home via private vehicle   Junious Silk, ANP pager [phone]  I have examined the patient, reviewed the chart and discussed the plan with Junious Silk, NP. I agree with the above note.   Calvert Cantor, MD [phone]

## 2012-01-29 NOTE — Discharge Instructions (Signed)
Peripherally Inserted Central Catheter (PICC)  Home Guide  A peripherally inserted central catheter (PICC) is a long, thin, flexible tube that is inserted into a vein in the upper arm. It is a form of intravenous (IV) access. It is considered to be a "central" line because the tip of the PICC ends in a large vein in your chest. This large vein is called the superior vena cava (SVC). The PICC tip ends in the SVC because there is a lot of blood flow in the SVC. This allows medicines and IV fluids to be quickly distributed throughout the body. The PICC is inserted using a sterile technique by a specially trained nurse or physician. After the PICC is inserted, a chest X-ray is done to be sure it is in the correct place.   A PICC may be placed for different reasons, such as:   To give medicines and liquid nutrition that can only be given through a central line. Examples are:   Certain antibiotic treatments.   Chemotherapy.   Total parenteral nutrition (TPN).   To take frequent blood samples.   To give IV fluids and blood products.   If there is difficulty placing a peripheral intravenous (PIV) catheter.  If taken care of properly, a PICC can remain in place for several months. A PICC can also allow patients to go home early. Medicine and PICC care can be managed at home by a family member or home healthcare team.  RISKS AND COMPLICATIONS  Possible problems with a PICC can occasionally occur. This may include:   A clot (thrombus) forming in or at the tip of the PICC. This can cause the PICC to become clogged. A "clot-busting" medicine called tissue plasminogen activator (tPA) can be inserted into the PICC to help break up the clot.   Inflammation of the vein (phlebitis) in which the PICC is placed. Signs of inflammation may include redness, pain at the insertion site, red streaks, or being able to feel a "cord" in the vein where the PICC is located.   Infection in the PICC or at the insertion site. Signs of  infection may include fever, chills, redness, swelling, or pus drainage from the PICC insertion site.   PICC movement (malposition). The PICC tip may migrate from its original position due to excessive physical activity, forceful coughing, sneezing, or vomiting.   A break or cut in the PICC. It is important to not use scissors near the PICC.   Nerve or tendon irritation or injury during PICC insertion.  HOME CARE INSTRUCTIONS  Activity   You may bend your arm and move it freely. If your PICC is near or at the bend of your elbow, avoid activity with repeated motion at the elbow.   Avoid lifting heavy objects as instructed by your caregiver.   Avoid using a crutch with the arm on the same side as your PICC. You may need to use a walker.  PICC Dressing   Keep your PICC bandage (dressing) clean and dry to prevent infection.   Ask your caregiver when you may shower. Ask your caregiver to teach you how to wrap the PICC when you do take a shower.   Do not bathe, swim, or use hot tubs when you have a PICC.   Change the PICC dressing as instructed by your caregiver.   Change your PICC dressing if it becomes loose or wet.  General PICC Care   Check the PICC insertion site daily for leakage, redness, swelling,   caregiver. Let your caregiver know right away if the PICC is difficult to flush or does not flush. Do not use force to flush the PICC.   Do not use a syringe that is less than 10 mLs to flush the PICC.   Never pull or tug on the PICC.   Avoid blood pressure checks on the arm with the PICC.   Keep your PICC identification card with you at all times.   Do not take the PICC out yourself. Only a trained clinical professional should remove the PICC.  SEEK IMMEDIATE MEDICAL CARE IF:  Your PICC is accidently pulled all the way out. If this happens, cover the insertion site with a bandage or gauze dressing. Do not throw the PICC away.  Your caregiver will need to inspect it.   Your PICC was tugged or pulled and has partially come out. Do not  push the PICC back in.   There is any type of drainage, redness, or swelling where the PICC enters the skin.   You cannot flush the PICC, it is difficult to flush, or the PICC leaks around the insertion site when it is flushed.   You hear a "flushing" sound when the PICC is flushed.   You have pain, discomfort, or numbness in your arm, shoulder, or jaw on the same side as the PICC .   You feel your heart "racing" or skipping beats.   You notice a hole or tear in the PICC.   You develop chills or a fever.  MAKE SURE YOU:   Understand these instructions.   Will watch your condition.   Will get help right away if you are not doing well or get worse.  Document Released: 04/11/2003 Document Revised: 09/24/2011 Document Reviewed: 02/09/2011 [facility] Patient Information 2012 [place], [place].Prednisone tablets What is this medicine? PREDNISONE (PRED ni sone) is a corticosteroid. It is commonly used to treat inflammation of the skin, joints, lungs, and other organs. Common conditions treated include asthma, allergies, and arthritis. It is also used for other conditions, such as blood disorders and diseases of the adrenal glands. This medicine may be used for other purposes; ask your health care provider or pharmacist if you have questions. What should I tell my health care provider before I take this medicine? They need to know if you have any of these conditions: -Cushing's syndrome -diabetes -glaucoma -heart disease -high blood pressure -infection (especially a virus infection such as chickenpox, cold sores, or herpes) -kidney disease -liver disease -mental illness -myasthenia gravis -osteoporosis -seizures -stomach or intestine problems -thyroid disease -an unusual or allergic reaction to lactose, prednisone, other medicines, foods, dyes, or preservatives -pregnant or  trying to get pregnant -breast-feeding How should I use this medicine? Take this medicine by mouth with a glass of water. Follow the directions on the prescription label. Take this medicine with food. If you are taking this medicine once a day, take it in the morning. Do not take more medicine than you are told to take. Do not suddenly stop taking your medicine because you may develop a severe reaction. Your doctor will tell you how much medicine to take. If your doctor wants you to stop the medicine, the dose may be slowly lowered over time to avoid any side effects. Talk to your pediatrician regarding the use of this medicine in children. Special care may be needed. Overdosage: If you think you have taken too much of this medicine contact a poison control center or emergency room at  once. NOTE: This medicine is only for you. Do not share this medicine with others. What if I miss a dose? If you miss a dose, take it as soon as you can. If it is almost time for your next dose, talk to your doctor or health care professional. You may need to miss a dose or take an extra dose. Do not take double or extra doses without advice. What may interact with this medicine? Do not take this medicine with any of the following medications: -metyrapone -mifepristone This medicine may also interact with the following medications: -aminoglutethimide -amphotericin B -aspirin and aspirin-like medicines -barbiturates -certain medicines for diabetes, like glipizide or glyburide -cholestyramine -cholinesterase inhibitors -cyclosporine -digoxin -diuretics -ephedrine -female hormones, like estrogens and birth control pills -isoniazid -ketoconazole -NSAIDS, medicines for pain and inflammation, like ibuprofen or naproxen -phenytoin -rifampin -toxoids -vaccines -warfarin This list may not describe all possible interactions. Give your health care provider a list of all the medicines, herbs, non-prescription  drugs, or dietary supplements you use. Also tell them if you smoke, drink alcohol, or use illegal drugs. Some items may interact with your medicine. What should I watch for while using this medicine? Visit your doctor or health care professional for regular checks on your progress. If you are taking this medicine over a prolonged period, carry an identification card with your name and address, the type and dose of your medicine, and your doctor's name and address. This medicine may increase your risk of getting an infection. Tell your doctor or health care professional if you are around anyone with measles or chickenpox, or if you develop sores or blisters that do not heal properly. If you are going to have surgery, tell your doctor or health care professional that you have taken this medicine within the last twelve months. Ask your doctor or health care professional about your diet. You may need to lower the amount of salt you eat. This medicine may affect blood sugar levels. If you have diabetes, check with your doctor or health care professional before you change your diet or the dose of your diabetic medicine. What side effects may I notice from receiving this medicine? Side effects that you should report to your doctor or health care professional as soon as possible: -allergic reactions like skin rash, itching or hives, swelling of the face, lips, or tongue -changes in emotions or moods -changes in vision -depressed mood -eye pain -fever or chills, cough, sore throat, pain or difficulty passing urine -increased thirst -swelling of ankles, feet Side effects that usually do not require medical attention (report to your doctor or health care professional if they continue or are bothersome): -confusion, excitement, restlessness -headache -nausea, vomiting -skin problems, acne, thin and shiny skin -trouble sleeping -weight gain This list may not describe all possible side effects. Call your  doctor for medical advice about side effects. You may report side effects to FDA at 1-800-FDA-1088. Where should I keep my medicine? Keep out of the reach of children. Store at room temperature between 15 and 30 degrees C (59 and 86 degrees F). Protect from light. Keep container tightly closed. Throw away any unused medicine after the expiration date. NOTE: This sheet is a summary. It may not cover all possible information. If you have questions about this medicine, talk to your doctor, pharmacist, or health care provider.  2012, [employer]. (05/21/2011 10:57:14 AM)Addison's Disease Addison's disease is a glandular (endocrine) or hormonal disorder. It is also called adrenal insufficiency or hypocortisolism.  It affects about 1 in 100,000 people. It can affect men and women in all age groups. This disease occurs when the adrenal glands do not make enough of the hormone cortisol. In some cases, the adrenal glands also do not make the hormone aldosterone. Without the right levels of these hormones, your body cannot maintain critical life functions. The adrenal glands are located just above the kidneys. Cortisol is a steroid hormone. Its most important job is to help the body respond to stress. Cortisol also helps the body to:  Maintain blood pressure and heart(cardiovascular) function.   Slow the immune system's response to inflammation.   Control the use of proteins, carbohydrates (sugars), and fats.   Maintain a sense of well-being.  CAUSES  A lack of cortisol can happen for different reasons.   Primary adrenal insufficiency is Addison's disease. The adrenal glands do not produce enough, or any, cortisol. Usually, aldosterone is also not produced.   Secondary adrenal insufficiency. The pituitary gland may not make enough of a hormone called ACTH (adrenocorticotropin). This hormone causes the adrenal glands to produce cortisol. Usually, there is enough aldosterone.  Primary Adrenal  Insufficiencycan be caused by:  Autoimmune disease. Your body can produce antibodies that attack its own organs (in this case, your adrenal glands). The reasons why this happens are not well understood at this time. Sometimes, other organs are also affected (the polyglandular autoimmune syndromes).   An infection of the adrenal glands. Possible causes include tuberculosis, viruses (including HIV), and fungal infections.  Secondary Adrenal Insufficiency This form is much more common than the primary form. It can be traced to a lack of ACTH. Without ACTH, the adrenal glands cannot make cortisol. Causes include:  Diseases of the pituitary gland. This is a small gland in the brain that controls many important body functions. The pituitary gland produces ACTH, among other hormones. Pituitary gland tumors, injury, or surgery can cause inadequate ACTH production, which causes inadequate cortisol production.   Medications:   Megestrol (used for cancer treatment and to stimulate appetite) and some pain medications can impair production of cortisol.   Use of cortisol medication (steroids) causes your adrenal glands to not produce cortisol. When you stop this medication, it can take time for the adrenal glands to start producing cortisol again. This can be a dangerous situation, requiring slowly reducing your cortisol medicine.  SYMPTOMS  Symptoms of adrenal insufficiency normally begin slowly. Problems seen with the disease are:  Severe tiredness (fatigue).   Muscle weakness.   Loss of appetite.   Weight loss.  About 50% of the time, the following symptoms occur:   Nausea, vomiting and diarrhea.   Drops in blood pressure.   Dizziness or fainting.   Darkening of the skin (with primary disease only).   Being easily angered (irritable).   Depression.   Salt craving.   Low blood sugar (hypoglycemia).   Irregular or no menstrual periods.  The symptoms slowly get worse. They are often  ignored until a stressful event like an illness or an accident occurs. This is called an Addisonian crisis, or acute adrenal insufficiency. Without treatment, an Addisonian crisis can cause death. Symptoms of an Addisonian crisis include:  Sudden, severe pain in the lower back, abdomen, or legs.   Severe vomiting and diarrhea.   Dehydration.   Low blood pressure.   Loss of consciousness.  DIAGNOSIS  In its early stages, adrenal insufficiency can be hard to diagnose. Your caregiver will need to:  Review your medical  history.   Review your symptoms, such as dark tanning of the skin.   Perform lab tests. Results will show if levels of cortisol are too low, and will help identify the cause. CT scan or MRI scan of the adrenal and pituitary glands may also be necessary.  TREATMENT  With proper treatment, you can live a normal life with Addison's disease or adrenal insufficiency.  Missing hormones need to be replaced in order to treat Addison's disease. Cortisol is replaced with hydrocortisone tablets taken by mouth. Other forms of cortisol may also be used. Since cortisone levels normally are higher in the morning and lower in the evening, you may need different doses at different times of the day. Be sure to follow your instructions carefully.   If your body cannot maintain the right levels of salt (sodium) and fluids because of too little aldosterone, you will also be given a medication. This drug replaces aldosterone.  Important points about treatment:  Any sudden (acute) illness can increase your body's need for cortisol. Surgery, or other stress on the body, can do the same. If you have Addison's disease and you are ill or having surgery, you will need an increase in hormone medication to prevent an Addisonian crisis. Untreated, an Addisonian crisis can cause death.   If you are too ill to take your medication or you cannot keep it down, you must take medicine through a shot (injection).  You or someone who lives with you will need to learn how to give you this injection. The shot will take the place of hydrocortisone. If you find it necessary to give yourself injectable medication, call your caregiver right away, or go to the nearest hospital emergency room.  HOME CARE INSTRUCTIONS   Always carry an identification card stating your condition in case of an emergency. The card should:   Alert emergency personnel about the need to inject 100 mg of cortisol if you are severely hurt or cannot respond.   Include your caregiver's name and phone number.   Include the name and number of your closest relative to contact.   When traveling, carry a needle, syringe, and an injectable form of cortisol for emergencies.   Know how to increase medication during periods of stress or mild colds.   Wear a warning bracelet or neck chain to alert emergency personnel. Many companies sell medical ID products.   Take your medications as prescribed. Do not stop your medications without medical supervision.   Learn about your condition. Ask your caregiver for further resources. This is a potentially dangerous, but easily managed illness.  SEEK MEDICAL CARE IF:   You have Addison's disease and you are in need of surgery.   You have Addison's disease and you have an acute illness.   You have weakness, weight loss, or other unexplained symptoms.  SEEK IMMEDIATE MEDICAL CARE IF:   You have symptoms of crisis:   Sudden, severe pain in the lower back, abdomen, or legs.   Severe vomiting and diarrhea.   Dehydration.   Low blood pressure.   Loss of consciousness.   You are experiencing severe:   Infections or other illness.   Vomiting.   Diarrhea.  Document Released: 10/05/2005 Document Revised: 09/24/2011 Document Reviewed: 02/26/2009 [facility] Patient Information 2012 [place], [place].Spinal Headache, Conservative Treatment Sometimes following a spinal tap (lumbar puncture) or an  epidural, there may be CSF (cerebrospinal fluid) leakage through a hole in the dura. The dura is one of the protective membranes covering  the brain and spinal cord. This leakage produces low CSF pressure. Pressure on the pain sensitive structures in the brain and relaxation (dilating) of the vessels in the head when the patient is upright is thought to cause headaches. The headache usually lasts until the hole heals and CSF pressure is restored. This can last a few days and rarely more than one week. THERAPEUTIC ALTERNATIVES TO EPIDURAL BLOOD PATCHING INCLUDE:  Bedrest: The symptoms of PDPH are lessened by lying down on your back. Lying on your back for a period of time (such as 24 hours) after a dural puncture has no preventative effect. It only delays the start of the PDPH.   Hydration: Normal taking in fluids (hydration) should be maintained. Extra hydration does not alleviate the headache, but dehydration may make symptoms worse.   Analgesics: Narcotic analgesics and, in some instances, non-steroidal anti-inflammatory agents are often given for treatment of the headache pain.   Caffeine: Caffeine intake is a therapy to help shrink the cerebral vessels. Patients should have caffeine early in the day so that he/she can sleep at night. 500 mg of Caffeine sodium benzoate can be given in the vein (intravenously). It can be given once two hours later if the first dose does not have the desired effect. Caffeinated beverages (colas, tea, coffee) can be somewhat effective also.   Epidural Saline Injection: Large-volume shots (boluses) or infusion of epidural normal saline can help to quickly and temporarily increase the epidural pressure. The infusions slow the speed at which CSF leaks through the dural hole. This may speed the natural healing process. Although epidural saline can be a useful technique, epidural blood patches often have a higher success rate.  SEEK IMMEDIATE MEDICAL ATTENTION IF:   You do  not get relief from the medications given to you or your pain becomes severe.   You have an unexplained oral temperature above 102 F (38.9 C), or as your caregiver suggests.   You have a stiff neck.   You lose bowel or bladder control.   You develop severe symptoms different from your first symptoms.   You have trouble walking.  Document Released: 03/27/2002 Document Revised: 09/24/2011 Document Reviewed: 01/28/2009 [facility] Patient Information 2012 [place], [place].Bacterial Meningitis Meningitis is an inflammation of the fluid and membranes surrounding the spinal cord and brain. It is sometimes referred to as spinal meningitis. Bacterial meningitis is caused by a bacterial infection. It is important to know whether a virus or bacterium is causing your meningitis. This is because the severity of illness and the treatment differ. Viral meningitis is generally less severe and can go away without specific treatment. Bacterial meningitis is treated with antibiotics. Meningitis can also be caused by fungi, parasites, certain medicines, cancer, and autoimmune disorders. People with lowered immune systems are at greater risk for poor outcomes from meningitis. It is important to get treatment as soon as possible to minimize the impact of a meningitis infection. Long-term complications could include seizures, hearing loss, weakness, paralysis, blindness, or cognitive impairment. CAUSES Bacterial meningitis occurs when certain bacteria reach the membranes covering the brain. These bacteria may enter the bloodstream and travel to the brain. They may also reach the brain directly through the nasal cavity or through an open wound in the skull. It is important to know which type of bacteria is causing the meningitis. Antibiotics can prevent some types of meningitis from spreading and infecting other people. The types of bacteria that commonly cause meningitis vary by age group. Common causes by  age group are  listed below.  Premature babies and newborns up to 3 months   Group B streptococcus, which exists in the vaginal tract.   Escherichia coli, which exists in the digestive tract.   Listeria monocytogenes.   Older children   Neisseria meningitidis, often refered to as meningococcus.   Streptococcus pneumoniae.   Haemophilus influenzae type B, in children under 47 years old.   Adults   Neisseria meningitidis.   Streptococcus pneumoniae.   Listeria monocytogenes, in adults over 30 years old.  People who have a cerebral shunt, cochlear implant, or any similar device are at increased risk of infection. Rarely, an infection in the head and neck area (such as otitis media or mastoiditis) can lead to meningitis. Tuberculous meningitis is caused by an infection with Mycobacterium tuberculosis. This type of meningitis is more common in countries where tuberculosis is common. It may also occur in people with immune system problems, such as AIDS.  SYMPTOMS  Symptoms can develop over several hours. They may even take 1 to 2 days to develop. Common symptoms of meningitis in people over the age of 2 years include:  High fever.   Headache.   Stiff neck.   Irritability.   Nausea and vomiting.   Decreased appetite.   Fatigue.   Discomfort from exposure to light.   Discomfort from exposure to loud noise.   Trouble walking.   Altered mental status.   Seizures.  A rash may occur in severe cases of meningitis. Meningitis caused by the bacterium Neisseria meningitidis (known as "meningococcal meningitis") may cause a rapidly spreading rash, which appears before other symptoms. The rash consists of many small, irregular purple or red spots (petechiae) on the trunk, lower extremities, mucous membranes, conjunctiva of the eye, and sometimes on the palms of the hands or soles of the feet. DIAGNOSIS  Early diagnosis and treatment are very important. If you have symptoms, you should see your  caregiver right away. Your evaluation may include lab tests of your blood and spinal fluid. A spinal fluid sample is taken through a procedure called a lumbar puncture or spinal tap. During the procedure, a needle is inserted into an area in the lower back. The fluid is examined and cultures are done. The type of bacteria causing the infection will be identified. You may also have a CT scan of your brain as part of your evaluation.If there is suspicion of an infection or inflammation of the brain (encephalitis), an MRI scan may be done. PREVENTION Bacterial meningitis is contagious. The bacteria spread from person to person in secretions from the lungs and throat. This may happen when you cough, sneeze, or kiss someone. None of the bacteria that cause meningitis are as contagious as the common cold or the flu. The bacteria are not spread by simply breathing the same air as a person who has meningitis. The bacteria that cause meningitis can spread through close or prolonged contact with a patient who has certain types of bacterial meningitis. People who are at high risk should receive antibiotics to help prevent them from getting the disease. People at increased risk of getting the infection include:  Health care workers.   People who share a household with the patient.   Day-care center workers.   Anyone who has direct contact with the patient's oral secretions.  Some forms of meningitis (such as those associated with meningococcus,  Haemophilus influenzae type B, pneumococci, or mumps virus infections) may be prevented by immunization. Talk to  your caregiver about specific vaccines that are recommended for adults and children.  TREATMENT  Bacterial meningitis can be treated with many antibiotics. Treatment must be started early in the course of the disease. Antibiotics must be started immediately, even before the exact cause is determined. You will be given specific antibiotics once the exact cause  is known. Steroids may also be used to limit swelling of the brain. HOME CARE INSTRUCTIONS   Rest.   Drink enough water and fluids to keep your urine clear or pale yellow.   Take all medicine as prescribed.   Practice good hygiene to prevent others from getting sick.   Follow up with your caregiver as directed.  SEEK IMMEDIATE MEDICAL CARE IF:   You develop a high fever, neck stiffness, or confusion.   You have a headache that becomes severe or does not respond to pain medicine.   You develop dizziness.   You have a fast heartbeat.   You have a seizure.   You develop a rash.   You are taking antibiotics and are not getting better.   You develop severe vomiting or are unable to tolerate food or fluids.   You have any worsening symptoms.  MAKE SURE YOU:   Understand these instructions.   Will watch your condition.   Will get help right away if you are not doing well or get worse.  Document Released: 12/14/2001 Document Revised: 09/24/2011 Document Reviewed: 01/21/2011 ExitCare Patient Information 2012 ExitCare, [place]inusitis Sinuses are air pockets within the bones of your face. The growth of bacteria within a sinus leads to infection. The infection prevents the sinuses from draining. This infection is called sinusitis. SYMPTOMS  There will be different areas of pain depending on which sinuses have become infected.  The maxillary sinuses often produce pain beneath the eyes.   Frontal sinusitis may cause pain in the middle of the forehead and above the eyes.  Other problems (symptoms) include:  Toothaches.   Colored, pus-like (purulent) drainage from the nose.   Swelling, warmth, and tenderness over the sinus areas may be signs of infection.  TREATMENT  Sinusitis is most often determined by an exam.X-rays may be taken. If x-rays have been taken, make sure you obtain your results or find out how you are to obtain them. Your caregiver may give you medications  (antibiotics). These are medications that will help kill the bacteria causing the infection. You may also be given a medication (decongestant) that helps to reduce sinus swelling.  HOME CARE INSTRUCTIONS   Only take over-the-counter or prescription medicines for pain, discomfort, or fever as directed by your caregiver.   Drink extra fluids. Fluids help thin the mucus so your sinuses can drain more easily.   Applying either moist heat or ice packs to the sinus areas may help relieve discomfort.   Use saline nasal sprays to help moisten your sinuses. The sprays can be found at your local drugstore.  SEEK IMMEDIATE MEDICAL CARE IF:  You have a fever.   You have increasing pain, severe headaches, or toothache.   You have nausea, vomiting, or drowsiness.   You develop unusual swelling around the face or trouble seeing.  MAKE SURE YOU:   Understand these instructions.   Will watch your condition.   Will get help right away if you are not doing well or get worse.  Document Released: 10/05/2005 Document Revised: 09/24/2011 Document Reviewed: 05/04/2007 [facility] Patient Information 2012 [employer].[place].

## 2012-01-29 NOTE — Discharge Summary (Signed)
Mid-line in place to [facility].  SW notified of pt's d/c for [facility] f/u with IV rocephin.  Pt given prescription for oxy IR, prednisone, and IV Rocephin.  F/U apt to be made with PCP in 2 weeks.  Pt to f/u with specialist for adrenal/cortisol insufficiency's.  Pt verbalized understanding of d/c instructions.  Pt ambulated out of hospital with medical staff and significant other by side.

## 2012-01-31 LAB — CULTURE, BLOOD (ROUTINE X 2)
Culture  Setup Time: 201304081415
Culture: NO GROWTH

## 2012-02-15 ENCOUNTER — Ambulatory Visit: Payer: Managed Care, Other (non HMO) | Admitting: Endocrinology

## 2012-02-17 ENCOUNTER — Other Ambulatory Visit: Payer: Self-pay | Admitting: Family Medicine

## 2012-02-19 ENCOUNTER — Other Ambulatory Visit: Payer: Managed Care, Other (non HMO)

## 2012-02-23 ENCOUNTER — Ambulatory Visit
Admission: RE | Admit: 2012-02-23 | Discharge: 2012-02-23 | Disposition: A | Discharge status: Home / Self Care | Payer: Managed Care, Other (non HMO) | Source: Ambulatory Visit | Attending: Family Medicine | Admitting: Family Medicine

## 2014-02-04 ENCOUNTER — Emergency Department (HOSPITAL_BASED_OUTPATIENT_CLINIC_OR_DEPARTMENT_OTHER)
Admission: EM | Admit: 2014-02-04 | Discharge: 2014-02-04 | Disposition: A | Discharge status: Home / Self Care | Payer: Managed Care, Other (non HMO) | Attending: Emergency Medicine | Admitting: Emergency Medicine

## 2014-02-04 ENCOUNTER — Emergency Department (HOSPITAL_BASED_OUTPATIENT_CLINIC_OR_DEPARTMENT_OTHER): Payer: Managed Care, Other (non HMO)

## 2014-02-04 ENCOUNTER — Encounter (HOSPITAL_BASED_OUTPATIENT_CLINIC_OR_DEPARTMENT_OTHER): Payer: Self-pay | Admitting: Emergency Medicine

## 2014-02-04 DIAGNOSIS — E079 Disorder of thyroid, unspecified: Secondary | ICD-10-CM | POA: Insufficient documentation

## 2014-02-04 DIAGNOSIS — Y836 Removal of other organ (partial) (total) as the cause of abnormal reaction of the patient, or of later complication, without mention of misadventure at the time of the procedure: Secondary | ICD-10-CM | POA: Insufficient documentation

## 2014-02-04 DIAGNOSIS — Y9389 Activity, other specified: Secondary | ICD-10-CM | POA: Insufficient documentation

## 2014-02-04 DIAGNOSIS — Z8709 Personal history of other diseases of the respiratory system: Secondary | ICD-10-CM | POA: Insufficient documentation

## 2014-02-04 DIAGNOSIS — S0083XA Contusion of other part of head, initial encounter: Secondary | ICD-10-CM | POA: Insufficient documentation

## 2014-02-04 DIAGNOSIS — Z79899 Other long term (current) drug therapy: Secondary | ICD-10-CM | POA: Insufficient documentation

## 2014-02-04 DIAGNOSIS — W010XXA Fall on same level from slipping, tripping and stumbling without subsequent striking against object, initial encounter: Secondary | ICD-10-CM | POA: Insufficient documentation

## 2014-02-04 DIAGNOSIS — S0990XA Unspecified injury of head, initial encounter: Secondary | ICD-10-CM

## 2014-02-04 DIAGNOSIS — F17200 Nicotine dependence, unspecified, uncomplicated: Secondary | ICD-10-CM | POA: Insufficient documentation

## 2014-02-04 DIAGNOSIS — Z8669 Personal history of other diseases of the nervous system and sense organs: Secondary | ICD-10-CM | POA: Insufficient documentation

## 2014-02-04 DIAGNOSIS — W1809XA Striking against other object with subsequent fall, initial encounter: Secondary | ICD-10-CM | POA: Insufficient documentation

## 2014-02-04 DIAGNOSIS — Z8701 Personal history of pneumonia (recurrent): Secondary | ICD-10-CM | POA: Insufficient documentation

## 2014-02-04 DIAGNOSIS — Z8679 Personal history of other diseases of the circulatory system: Secondary | ICD-10-CM | POA: Insufficient documentation

## 2014-02-04 DIAGNOSIS — S0003XA Contusion of scalp, initial encounter: Secondary | ICD-10-CM | POA: Insufficient documentation

## 2014-02-04 DIAGNOSIS — T8131XA Disruption of external operation (surgical) wound, not elsewhere classified, initial encounter: Secondary | ICD-10-CM | POA: Insufficient documentation

## 2014-02-04 DIAGNOSIS — Y929 Unspecified place or not applicable: Secondary | ICD-10-CM | POA: Insufficient documentation

## 2014-02-04 DIAGNOSIS — S1093XA Contusion of unspecified part of neck, initial encounter: Principal | ICD-10-CM

## 2014-02-04 HISTORY — DX: Disorder of thyroid, unspecified: E07.9

## 2014-02-04 MED ORDER — NAPROXEN 250 MG PO TABS
500.0000 mg | ORAL_TABLET | Freq: Once | ORAL | Status: AC
  Administered 2014-02-04: 500 mg via ORAL
  Filled 2014-02-04: qty 2

## 2014-02-04 NOTE — Discharge Instructions (Signed)
Head Injury, Adult °You have received a head injury. It does not appear serious at this time. Headaches and vomiting are common following head injury. It should be easy to awaken from sleeping. Sometimes it is necessary for you to stay in the emergency department for a while for observation. Sometimes admission to the hospital may be needed. After injuries such as yours, most problems occur within the first 24 hours, but side effects may occur up to 7 10 days after the injury. It is important for you to carefully monitor your condition and contact your health care provider or seek immediate medical care if there is a change in your condition. °WHAT ARE THE TYPES OF HEAD INJURIES? °Head injuries can be as minor as a bump. Some head injuries can be more severe. More severe head injuries include: °· A jarring injury to the brain (concussion). °· A bruise of the brain (contusion). This mean there is bleeding in the brain that can cause swelling. °· A cracked skull (skull fracture). °· Bleeding in the brain that collects, clots, and forms a bump (hematoma). °WHAT CAUSES A HEAD INJURY? °A serious head injury is most likely to happen to someone who is in a car wreck and is not wearing a seat belt. Other causes of major head injuries include bicycle or motorcycle accidents, sports injuries, and falls. °HOW ARE HEAD INJURIES DIAGNOSED? °A complete history of the event leading to the injury and your current symptoms will be helpful in diagnosing head injuries. Many times, pictures of the brain, such as CT or MRI are needed to see the extent of the injury. Often, an overnight hospital stay is necessary for observation.  °WHEN SHOULD I SEEK IMMEDIATE MEDICAL CARE?  °You should get help right away if: °· You have confusion or drowsiness. °· You feel sick to your stomach (nauseous) or have continued, forceful vomiting. °· You have dizziness or unsteadiness that is getting worse. °· You have severe, continued headaches not  relieved by medicine. Only take over-the-counter or prescription medicines for pain, fever, or discomfort as directed by your health care provider. °· You do not have normal function of the arms or legs or are unable to walk. °· You notice changes in the black spots in the center of the colored part of your eye (pupil). °· You have a clear or bloody fluid coming from your nose or ears. °· You have a loss of vision. °During the next 24 hours after the injury, you must stay with someone who can watch you for the warning signs. This person should contact local emergency services (911 in the [place] if you have seizures, you become unconscious, or you are unable to wake up. °HOW CAN I PREVENT A HEAD INJURY IN THE FUTURE? °The most important factor for preventing major head injuries is avoiding motor vehicle accidents.  To minimize the potential for damage to your head, it is crucial to wear seat belts while riding in motor vehicles. Wearing helmets while bike riding and playing collision sports (like football) is also helpful. Also, avoiding dangerous activities around the house will further help reduce your risk of head injury.  °WHEN CAN I RETURN TO NORMAL ACTIVITIES AND ATHLETICS? °You should be reevaluated by your health care provider before returning to these activities. If you have any of the following symptoms, you should not return to activities or contact sports until 1 week after the symptoms have stopped: °· Persistent headache. °· Dizziness or vertigo. °· Poor attention and concentration. °·   Confusion.  Memory problems.  Nausea or vomiting.  Fatigue or tire easily.  Irritability.  Intolerant of bright lights or loud noises.  Anxiety or depression.  Disturbed sleep. MAKE SURE YOU:   Understand these instructions.  Will watch your condition.  Will get help right away if you are not doing well or get worse. Document Released: 10/05/2005 Document Revised: 07/26/2013 Document Reviewed:  06/12/2013 [facility] Patient Information 2014 [place], [place]. Contusion A contusion is a deep bruise. Contusions are the result of an injury that caused bleeding under the skin. The contusion may turn blue, purple, or yellow. Minor injuries will give you a painless contusion, but more severe contusions may stay painful and swollen for a few weeks.  CAUSES  A contusion is usually caused by a blow, trauma, or direct force to an area of the body. SYMPTOMS   Swelling and redness of the injured area.  Bruising of the injured area.  Tenderness and soreness of the injured area.  Pain. DIAGNOSIS  The diagnosis can be made by taking a history and physical exam. An X-ray, CT scan, or MRI may be needed to determine if there were any associated injuries, such as fractures. TREATMENT  Specific treatment will depend on what area of the body was injured. In general, the best treatment for a contusion is resting, icing, elevating, and applying cold compresses to the injured area. Over-the-counter medicines may also be recommended for pain control. Ask your caregiver what the best treatment is for your contusion. HOME CARE INSTRUCTIONS   Put ice on the injured area.  Put ice in a plastic bag.  Place a towel between your skin and the bag.  Leave the ice on for 15-20 minutes, 03-04 times a day.  Only take over-the-counter or prescription medicines for pain, discomfort, or fever as directed by your caregiver. Your caregiver may recommend avoiding anti-inflammatory medicines (aspirin, ibuprofen, and naproxen) for 48 hours because these medicines may increase bruising.  Rest the injured area.  If possible, elevate the injured area to reduce swelling. SEEK IMMEDIATE MEDICAL CARE IF:   You have increased bruising or swelling.  You have pain that is getting worse.  Your swelling or pain is not relieved with medicines. MAKE SURE YOU:   Understand these instructions.  Will watch your  condition.  Will get help right away if you are not doing well or get worse. Document Released: 07/15/2005 Document Revised: 12/28/2011 Document Reviewed: 08/10/2011 [facility]oExitCare Patient Information 2014 [place] WestExitCare, MarylandLLC.

## 2014-02-04 NOTE — ED Notes (Signed)
Patient states she fell last night on the right side of her face on concrete, c/o R side facial pain. Concerned because she had surgery on her face in march. Swelling below R eye

## 2014-02-04 NOTE — ED Provider Notes (Signed)
CSN: [id number]     Arrival date & time 02/04/14  0708 History   First MD Initiated Contact with Patient 02/04/14 0800     Chief Complaint  Patient presents with  . Fall     (Consider location/radiation/quality/duration/timing/severity/associated sxs/prior Treatment) HPI Comments: Pt is a 53 y.o. female with Pmhx as above who presents with with facial injury after fall last pm. She was slipping on hot tub cover when if flipped over, she hit face on cement. No LOC. No numbness, weakness, confusion. 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   Patient is a 53 y.o. female presenting with fall. The history is provided by the patient. No language interpreter was used.  Fall This is a new problem. The current episode started 6 to 12 hours ago. The problem occurs rarely. The problem has been gradually worsening. Associated symptoms include headaches. Pertinent negatives include no chest pain, no abdominal pain and no shortness of breath. Exacerbated by: palpation. Nothing relieves the symptoms. Treatments tried: ice. The treatment provided mild relief.    Past Medical History  Diagnosis Date  . Hypotension   . Bronchitis   . Pneumonia   . Meningitis     01/2012  . Thyroid disorder    Past Surgical History  Procedure Laterality Date  . Parotidectomy    . Tonsillectomy    . Elbow surgery      Right   No family history on file. History  Substance Use Topics  . Smoking status: Current Every Day Smoker -- 0.50 packs/day for 15 years    Types: Cigarettes  . Smokeless tobacco: Not on file  . Alcohol Use: Yes   OB History   Grav Para Term Preterm Abortions TAB SAB Ect Mult Living                 Review of Systems  Constitutional: Negative for fever, chills, diaphoresis, activity change, appetite change and fatigue.  HENT: Negative for congestion, facial swelling, rhinorrhea and sore throat.   Eyes: Negative for  photophobia and discharge.  Respiratory: Negative for cough, chest tightness and shortness of breath.   Cardiovascular: Negative for chest pain, palpitations and leg swelling.  Gastrointestinal: Negative for nausea, vomiting, abdominal pain and diarrhea.  Endocrine: Negative for polydipsia and polyuria.  Genitourinary: Negative for dysuria, frequency, difficulty urinating and pelvic pain.  Musculoskeletal: Negative for arthralgias, back pain, neck pain and neck stiffness.  Skin: Negative for color change and wound.  Allergic/Immunologic: Negative for immunocompromised state.  Neurological: Positive for headaches. Negative for weakness and numbness.  Hematological: Does not bruise/bleed easily.  Psychiatric/Behavioral: Negative for confusion and agitation.      Allergies  Tetracyclines & related and Codeine  Home Medications   Prior to Admission medications   Medication Sig Start Date End Date Taking? Authorizing Provider  Cholecalciferol (VITAMIN D) 2000 UNITS tablet Take 2,000 Units by mouth daily.    Historical Provider, MD  cyclobenzaprine (FLEXERIL) 5 MG tablet Take 5 mg by mouth 3 (three) times daily as needed. For muscle spasms    Historical Provider, MD  dextrose 5 % SOLN 50 mL with cefTRIAXone 2 G SOLR 2 g Inject 2 g into the vein every 12 (twelve) hours. 01/29/12   Russella DarAllison L Ellis, NP  levothyroxine (SYNTHROID, LEVOTHROID) 125 MCG tablet Take 125 mcg by mouth every evening.      Historical Provider, MD  liothyronine (CYTOMEL) 25 MCG tablet Take 12.5  mcg by mouth daily.    Historical Provider, MD  traMADol (ULTRAM) 50 MG tablet Take 50 mg by mouth every 6 (six) hours as needed. For pain    Historical Provider, MD   BP 132/61  Pulse 90  Temp(Src) 98 F (36.7 C) (Oral)  Resp 18  Ht 5\' 4"  (1.626 m)  Wt 117 lb (53.071 kg)  BMI 20.07 kg/m2  SpO2 100%  LMP 01/04/2014 Physical Exam  Constitutional: She is oriented to person, place, and time. She appears well-developed and  well-nourished. No distress.  HENT:  Head: Normocephalic and atraumatic.    Mouth/Throat: No oropharyngeal exudate.  Eyes: Pupils are equal, round, and reactive to light.  Neck: Normal range of motion. Neck supple.  Cardiovascular: Normal rate, regular rhythm and normal heart sounds.  Exam reveals no gallop and no friction rub.   No murmur heard. Pulmonary/Chest: Effort normal and breath sounds normal. No respiratory distress. She has no wheezes. She has no rales.  Abdominal: Soft. Bowel sounds are normal. She exhibits no distension and no mass. There is no tenderness. There is no rebound and no guarding.  Musculoskeletal: Normal range of motion. She exhibits no edema and no tenderness.  Neurological: She is alert and oriented to person, place, and time.  Skin: Skin is warm and dry.  Psychiatric: She has a normal mood and affect.    ED Course  Procedures (including critical care time) Labs Review Labs Reviewed - No data to display  Imaging Review Ct Head Wo Contrast  02/04/2014   CLINICAL DATA:  Larey SeatFell injuring face.  Head pain.  Face pain.  EXAM: CT HEAD WITHOUT CONTRAST  CT MAXILLOFACIAL WITHOUT CONTRAST  TECHNIQUE: Multidetector CT imaging of the head and maxillofacial structures were performed using the standard protocol without intravenous contrast. Multiplanar CT image reconstructions of the maxillofacial structures were also generated.  COMPARISON:  CT head 01/23/2012.  FINDINGS: CT HEAD FINDINGS  No evidence for acute infarction, hemorrhage, mass lesion, hydrocephalus, or extra-axial fluid. There is no atrophy or white matter disease. Calvarium is intact. Clear sinuses and mastoids.  CT MAXILLOFACIAL FINDINGS  There is soft tissue swelling over the right cheek with a subcutaneous hematoma. No underlying facial fracture. No sinus air-fluid level. No blowout injury. No retrobulbar hemorrhage. Zygoma intact. TMJs located. No visible complications status post right parotid surgery. No  regional adenopathy. No visible missing teeth. Visualized upper cervical region demonstrates moderate spondylosis at C4-5, C5-6, and C6-7.  IMPRESSION: No acute intracranial abnormality.  Right cheek soft tissue swelling. No facial fracture. No blowout injury.   Electronically Signed   By: Davonna BellingJohn  Curnes M.D.   On: 02/04/2014 09:21   Ct Maxillofacial Wo Cm  02/04/2014   CLINICAL DATA:  Larey SeatFell injuring face.  Head pain.  Face pain.  EXAM: CT HEAD WITHOUT CONTRAST  CT MAXILLOFACIAL WITHOUT CONTRAST  TECHNIQUE: Multidetector CT imaging of the head and maxillofacial structures were performed using the standard protocol without intravenous contrast. Multiplanar CT image reconstructions of the maxillofacial structures were also generated.  COMPARISON:  CT head 01/23/2012.  FINDINGS: CT HEAD FINDINGS  No evidence for acute infarction, hemorrhage, mass lesion, hydrocephalus, or extra-axial fluid. There is no atrophy or white matter disease. Calvarium is intact. Clear sinuses and mastoids.  CT MAXILLOFACIAL FINDINGS  There is soft tissue swelling over the right cheek with a subcutaneous hematoma. No underlying facial fracture. No sinus air-fluid level. No blowout injury. No retrobulbar hemorrhage. Zygoma intact. TMJs located. No visible complications status post  right parotid surgery. No regional adenopathy. No visible missing teeth. Visualized upper cervical region demonstrates moderate spondylosis at C4-5, C5-6, and C6-7.  IMPRESSION: No acute intracranial abnormality.  Right cheek soft tissue swelling. No facial fracture. No blowout injury.   Electronically Signed   By: Davonna Belling M.D.   On: 02/04/2014 09:21     EKG Interpretation None      MDM   Final diagnoses:  Facial contusion  Closed head injury without concussion    Pt is a 53 y.o. female with Pmhx as above who presents with with facial injury after fall last pm. She was slipping on hot tub cover when if flipped over, she hit face on cement. No LOC.  No numbness, weakness, confusion. She had large hematoma/ecchymosis over R face w/ +ttp over R maxilla, slight dehiscence of posterior auricular surgical site from rececnt parotidectomy for benign tumor. CT head/face with NAICA, no acute fractures, +SQ hematoma.  Rec NSAIDS, ice, f/u with facial surgeon, bacitracin to surgical site.  If symptoms worsen including worsening pain, fever, trouble swallowing, redness, draining around surgical site, she will return to ED.         Shanna Cisco, MD 02/04/14 [phone]

## 2016-11-16 ENCOUNTER — Other Ambulatory Visit: Payer: Self-pay | Admitting: Obstetrics & Gynecology

## 2016-11-16 DIAGNOSIS — R928 Other abnormal and inconclusive findings on diagnostic imaging of breast: Secondary | ICD-10-CM

## 2016-11-24 ENCOUNTER — Ambulatory Visit
Admission: RE | Admit: 2016-11-24 | Discharge: 2016-11-24 | Disposition: A | Discharge status: Home / Self Care | Payer: 59 | Source: Ambulatory Visit | Attending: Obstetrics & Gynecology | Admitting: Obstetrics & Gynecology

## 2016-11-24 DIAGNOSIS — R928 Other abnormal and inconclusive findings on diagnostic imaging of breast: Secondary | ICD-10-CM

## 2019-06-30 ENCOUNTER — Other Ambulatory Visit: Payer: Self-pay

## 2019-06-30 ENCOUNTER — Encounter (HOSPITAL_BASED_OUTPATIENT_CLINIC_OR_DEPARTMENT_OTHER): Payer: Self-pay | Admitting: *Deleted

## 2019-06-30 ENCOUNTER — Emergency Department (HOSPITAL_BASED_OUTPATIENT_CLINIC_OR_DEPARTMENT_OTHER)
Admission: EM | Admit: 2019-06-30 | Discharge: 2019-06-30 | Disposition: A | Discharge status: Home / Self Care | Payer: Managed Care, Other (non HMO) | Attending: Emergency Medicine | Admitting: Emergency Medicine

## 2019-06-30 ENCOUNTER — Emergency Department (HOSPITAL_BASED_OUTPATIENT_CLINIC_OR_DEPARTMENT_OTHER): Payer: Managed Care, Other (non HMO)

## 2019-06-30 DIAGNOSIS — K561 Intussusception: Secondary | ICD-10-CM | POA: Diagnosis not present

## 2019-06-30 DIAGNOSIS — F17210 Nicotine dependence, cigarettes, uncomplicated: Secondary | ICD-10-CM | POA: Insufficient documentation

## 2019-06-30 DIAGNOSIS — E039 Hypothyroidism, unspecified: Secondary | ICD-10-CM | POA: Insufficient documentation

## 2019-06-30 DIAGNOSIS — Z79899 Other long term (current) drug therapy: Secondary | ICD-10-CM | POA: Insufficient documentation

## 2019-06-30 DIAGNOSIS — R1033 Periumbilical pain: Secondary | ICD-10-CM | POA: Insufficient documentation

## 2019-06-30 LAB — CBC WITH DIFFERENTIAL/PLATELET
Abs Immature Granulocytes: 0.02 10*3/uL (ref 0.00–0.07)
Basophils Absolute: 0.1 10*3/uL (ref 0.0–0.1)
Basophils Relative: 1 %
Eosinophils Absolute: 0.4 10*3/uL (ref 0.0–0.5)
Eosinophils Relative: 4 %
HCT: 46.4 % — ABNORMAL HIGH (ref 36.0–46.0)
Hemoglobin: 14.8 g/dL (ref 12.0–15.0)
Immature Granulocytes: 0 %
Lymphocytes Relative: 23 %
Lymphs Abs: 2.2 10*3/uL (ref 0.7–4.0)
MCH: 30.2 pg (ref 26.0–34.0)
MCHC: 31.9 g/dL (ref 30.0–36.0)
MCV: 94.7 fL (ref 80.0–100.0)
Monocytes Absolute: 0.8 10*3/uL (ref 0.1–1.0)
Monocytes Relative: 8 %
Neutro Abs: 6 10*3/uL (ref 1.7–7.7)
Neutrophils Relative %: 64 %
Platelets: 367 10*3/uL (ref 150–400)
RBC: 4.9 MIL/uL (ref 3.87–5.11)
RDW: 14.6 % (ref 11.5–15.5)
WBC: 9.5 10*3/uL (ref 4.0–10.5)
nRBC: 0 % (ref 0.0–0.2)

## 2019-06-30 LAB — COMPREHENSIVE METABOLIC PANEL
ALT: 12 U/L (ref 0–44)
AST: 19 U/L (ref 15–41)
Albumin: 4.8 g/dL (ref 3.5–5.0)
Alkaline Phosphatase: 56 U/L (ref 38–126)
Anion gap: 11 (ref 5–15)
BUN: 12 mg/dL (ref 6–20)
CO2: 25 mmol/L (ref 22–32)
Calcium: 9.5 mg/dL (ref 8.9–10.3)
Chloride: 101 mmol/L (ref 98–111)
Creatinine, Ser: 0.74 mg/dL (ref 0.44–1.00)
GFR calc Af Amer: >60 mL/min (ref >60)
GFR calc non Af Amer: >60 mL/min (ref >60)
Glucose, Bld: 93 mg/dL (ref 70–99)
Potassium: 3.9 mmol/L (ref 3.5–5.1)
Sodium: 137 mmol/L (ref 135–145)
Total Bilirubin: 0.4 mg/dL (ref 0.3–1.2)
Total Protein: 7.7 g/dL (ref 6.5–8.1)

## 2019-06-30 LAB — URINALYSIS, ROUTINE W REFLEX MICROSCOPIC
Bilirubin Urine: NEGATIVE
Glucose, UA: NEGATIVE mg/dL
Hgb urine dipstick: NEGATIVE
Ketones, ur: NEGATIVE mg/dL
Leukocytes,Ua: NEGATIVE
Nitrite: NEGATIVE
Protein, ur: NEGATIVE mg/dL
Specific Gravity, Urine: 1.015 (ref 1.005–1.030)
pH: 7.5 (ref 5.0–8.0)

## 2019-06-30 LAB — LIPASE, BLOOD: Lipase: 29 U/L (ref 11–51)

## 2019-06-30 MED ORDER — SODIUM CHLORIDE 0.9 % IV BOLUS
1000.0000 mL | Freq: Once | INTRAVENOUS | Status: AC
  Administered 2019-06-30: 1000 mL via INTRAVENOUS

## 2019-06-30 MED ORDER — ONDANSETRON HCL 4 MG/2ML IJ SOLN
4.0000 mg | Freq: Once | INTRAMUSCULAR | Status: AC
  Administered 2019-06-30: 11:00:00 4 mg via INTRAVENOUS
  Filled 2019-06-30: qty 2

## 2019-06-30 MED ORDER — IOHEXOL 300 MG/ML  SOLN
100.0000 mL | Freq: Once | INTRAMUSCULAR | Status: AC | PRN
  Administered 2019-06-30: 100 mL via INTRAVENOUS

## 2019-06-30 MED ORDER — OXYCODONE-ACETAMINOPHEN 5-325 MG PO TABS: 1.0000 | ORAL_TABLET | ORAL | 0 refills | Status: DC | PRN

## 2019-06-30 MED ORDER — ONDANSETRON 4 MG PO TBDP: 4.0000 mg | ORAL_TABLET | Freq: Three times a day (TID) | ORAL | 0 refills | Status: DC | PRN

## 2019-06-30 NOTE — ED Triage Notes (Signed)
Pt c/o severe mid abd pain x 1 week , sent here by PMD for CT scan , denies n/v

## 2019-06-30 NOTE — ED Notes (Signed)
Patient transported to CT 

## 2019-06-30 NOTE — ED Provider Notes (Addendum)
[facility] Provider Note   CSN: [id number] Arrival date & time: 06/30/19  1001     History   Chief Complaint Chief Complaint  Patient presents with  . Abdominal Pain    HPI Tracey Flowers is a 58 y.o. female with Hx of thyroid disorder presenting to ED today for periumbilical abdominal pain for 1 week that has been progressively worsening. Patient reports that it feels like a screw driver turning inside her. Pain is mostly periumbilical and radiating to the right lower quadrant. She reports that at rest, pain is 3/10 and worsens to 8/10 with activity. She has associated nausea for past 48 hours but denies any vomiting. No changes in bowel habits. No association with food intake although patient has had decreased PO intake secondary to nausea. Denies any fevers or chills.    Past Medical History:  Diagnosis Date  . Bronchitis   . Hypotension   . Meningitis    01/2012  . Pneumonia   . Thyroid disorder     Patient Active Problem List   Diagnosis Date Noted  . Adrenal insufficiency ([place]) 01/28/2012  . Headache(784.0) 01/27/2012  . Nausea in adult 01/27/2012  . Hypotension 01/27/2012  . suspected bacterial Meningitis 01/24/2012  . Hypothyroidism 01/24/2012    Past Surgical History:  Procedure Laterality Date  . ELBOW SURGERY     Right  . PAROTIDECTOMY    . TONSILLECTOMY       OB History   No obstetric history on file.      Home Medications    Prior to Admission medications   Medication Sig Start Date End Date Taking? Authorizing Provider  Cholecalciferol (VITAMIN D) 2000 UNITS tablet Take 2,000 Units by mouth daily.    [provider]  cyclobenzaprine (FLEXERIL) 5 MG tablet Take 5 mg by mouth 3 (three) times daily as needed. For muscle spasms    [provider]  dextrose 5 % SOLN 50 mL with cefTRIAXone 2 G SOLR 2 g Inject 2 g into the vein every 12 (twelve) hours. 01/29/12   Samella Parr, NP  levothyroxine  (SYNTHROID, LEVOTHROID) 125 MCG tablet Take 125 mcg by mouth every evening.      [provider]  liothyronine (CYTOMEL) 25 MCG tablet Take 12.5 mcg by mouth daily.    [provider]  traMADol (ULTRAM) 50 MG tablet Take 50 mg by mouth every 6 (six) hours as needed. For pain    [provider]    Family History No family history on file.  Social History Social History   Tobacco Use  . Smoking status: Current Every Day Smoker    Packs/day: 0.50    Years: 15.00    Pack years: 7.50    Types: Cigarettes  Substance Use Topics  . Alcohol use: Yes  . Drug use: No     Allergies   Tetracyclines & related and Codeine   Review of Systems Review of Systems  Gastrointestinal: Positive for abdominal pain.  All other systems reviewed and are negative.    Physical Exam Updated Vital Signs BP (!) 126/58 (BP Location: Right Arm)   Pulse 67   Temp 98.3 F (36.8 C) (Oral)   Resp 14   Ht 5' 4.75" (1.645 m)   Wt 52.2 kg   LMP 01/04/2014   SpO2 98%   BMI 19.29 kg/m   Physical Exam Vitals signs and nursing note reviewed.  Constitutional:      Appearance: She is  well-developed.  HENT:     Head: Normocephalic and atraumatic.     Mouth/Throat:     Mouth: Mucous membranes are moist.  Eyes:     Extraocular Movements: Extraocular movements intact.  Cardiovascular:     Rate and Rhythm: Normal rate and regular rhythm.  Pulmonary:     Effort: Pulmonary effort is normal.     Breath sounds: Normal breath sounds.  Abdominal:     General: Abdomen is flat. Bowel sounds are normal.     Palpations: Abdomen is soft.     Tenderness: There is generalized abdominal tenderness.  Skin:    General: Skin is warm.     Capillary Refill: Capillary refill takes less than 2 seconds.  Neurological:     General: No focal deficit present.     Mental Status: She is alert and oriented to person, place, and time.  Psychiatric:        Mood and Affect: Mood normal.         Behavior: Behavior normal.      ED Treatments / Results  Labs (all labs ordered are listed, but only abnormal results are displayed) Labs Reviewed - No data to display  EKG None  Radiology No results found.  Procedures Procedures (including critical care time)  Medications Ordered in ED Medications - No data to display   Initial Impression / Assessment and Plan / ED Course  I have reviewed the triage vital signs and the nursing notes.  Pertinent labs & imaging results that were available during my care of the patient were reviewed by me and considered in my medical decision making (see chart for details).  Patient is a 58yo female with Hx of thyroid disease presenting with progressively worsening periumbilical abdominal pain radiating to the right lower quadrant for 1 week duration. She has had associated nausea and decreased PO intake over past 48 hours. On examination, patient afebrile and hemodynamically stable. Normoactive bowel sounds. Tenderness to palpation of the periumbilical region with guarding. Rovsing and psoas signs negative.   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 General surgery consulted. Recommendations: If patient stable and can tolerate PO, can discharge to home with close follow up.  Patient given ginger ale which she tolerated well. Patient to be discharged to home with medications for pain control and nausea. Instructions for close follow up with GI and return precautions given.    Final Clinical Impressions(s) / ED Diagnoses   Final diagnoses:  None    ED Discharge Orders    None       Eliezer BottomAslam, Sadia, MD 06/30/19 1259    Jacalyn LefevreHaviland, Gunda Maqueda, MD 06/30/19 1315    Jacalyn LefevreHaviland, Anes Rigel, MD 07/12/19 1341

## 2019-06-30 NOTE — Discharge Instructions (Signed)
Ms. Ow,  You presented to the ED with abdominal pain for one week duration. Your CT exam showed a 5.5cm intussusception involving the proximal jejunum. General surgery was consulted and given that you are stable at this time, recommendations for discharge to home with close follow up with your PCP and GI doctor in your network.  Plan discussed with you and you expressed understanding and are agreeable with the plan.   If you develop severe abdominal pain refractory to medication, nausea and vomiting, abdominal distension, fever/chills, or blood in stools please seek emergent medical attention.

## 2019-07-05 ENCOUNTER — Ambulatory Visit (INDEPENDENT_AMBULATORY_CARE_PROVIDER_SITE_OTHER): Payer: Managed Care, Other (non HMO) | Admitting: Gastroenterology

## 2019-07-05 ENCOUNTER — Encounter: Payer: Self-pay | Admitting: Gastroenterology

## 2019-07-05 VITALS — BP 98/62 | HR 68 | Temp 98.6°F | Ht 64.0 in | Wt 113.5 lb

## 2019-07-05 DIAGNOSIS — K5904 Chronic idiopathic constipation: Secondary | ICD-10-CM

## 2019-07-05 DIAGNOSIS — R1033 Periumbilical pain: Secondary | ICD-10-CM | POA: Diagnosis not present

## 2019-07-05 DIAGNOSIS — K561 Intussusception: Secondary | ICD-10-CM

## 2019-07-05 NOTE — Patient Instructions (Signed)
You have been scheduled for a small bowel follow thru at [facility] Radiology. Your appointment is on 07/07/2019 at 11am. Please arrive 15 minutes prior to your test for registration. Make certain not to have anything to eat or drink starting Midnight on the night before your test. If for some reason you need to reschedule your test, please call radiology at [phone]. ____________________________________________________________________ The Small Bowel Follow Thru examination is used to visualize the entire small bowel (intestines); specifically the connection between the small and large intestine. You will be positioned on a flat x-ray table and an image of your abdomen taken. Then the technologist will show the x-ray to the radiologist. The radiologist will instruct your technologist how much (1-2 cups) barium sulfate you will drink and when to begin taking the timed x-rays, usually 15-30 minutes after you begin drinking. Barium is a harmless substance that will highlight your small intestine by absorbing x-ray. The taste is chalky and it feels very heavy both in the cup and in your stomach.  After the first x-ray is taken and shown to the radiologist, he/she will determine when the next image is to be taken. This is repeated until the barium has reached the end of the small intestine and enters the beginning of the colon (cecum). At such time when the barium spills into the colon, you will be positioned on the x-ray table once again. The radiologist will use a fluoroscopic camera to take some detailed pictures of the connection between your small intestine and colon. The fluoroscope is an x-ray unit that works with a television/computer screen. The radiologist will apply pressure to your abdomen with his/her hand and a lead glove, a plastic paddle, or a paddle with an inflated rubber balloon on the end. This is to spread apart your loops of intestine so he/she can see all areas.   This test typically takes  around 1 hour to complete.  **Important** Drink plenty of water (8-10 cups/day) for a few days following the procedure to avoid constipation and blockage. The barium will make your stools white for a few days. ____________________________________________________________________   Dr Silverio Decamp  recommends that you complete a bowel purge (to clean out your bowels). Please do the following: Purchase a bottle of Miralax over the counter as well as a box of 5 mg dulcolax tablets. Take 4 dulcolax tablets. Wait 1 hour. You will then drink 6-8 capfuls of Miralax mixed in an adequate amount of water/juice/gatorade (you may choose which of these liquids to drink) over the next 2-3 hours. You should expect results within 1 to 6 hours after completing the bowel purge.  Take Miralax 1/2 capful a day   Take Benefiber 1 tablespoon twice daily with meals   I appreciate the  opportunity to care for you  Thank You   Harl Bowie , MD

## 2019-07-05 NOTE — Progress Notes (Signed)
Tracey BlewCynthia M Teall    [id number]    July 25, 1961  Primary Care Physician:Fried, Molly Maduroobert, MD  Referring Physician: Marinda ElkFried, Robert, MD [address] [place],  [place] [id number]   Chief complaint:  Abd pain, Constipation, Bloated  HPI: 58 year old female here for new patient visit. She developed severe abdominal pain, was evaluated in the ER June 30, 2019.  CT abdomen and pelvis with findings suggestive of proximal jejunum intussusception. Her symptoms have improved over the past few days.  She no longer has the severe pain. She has history of chronic constipation, has bowel movement irregularly.  She had colonoscopy and also small bowel video capsule study done by Dr. Kathrin RuddySweeney [[facility]] Her last colonoscopy was in September 2013, normal per patient.  Report not available during this visit to review  Denies any recent change in bowel habits, diet changes, melena or rectal bleeding. She has lost 5 or 6 pounds in the past few weeks, thinks most of it was in the last week when she is not eating due to pain.  Outpatient Encounter Medications as of 07/05/2019  Medication Sig  . Cholecalciferol (VITAMIN D) 2000 UNITS tablet Take 2,000 Units by mouth daily.  Marland Kitchen. levothyroxine (SYNTHROID, LEVOTHROID) 125 MCG tablet Take 125 mcg by mouth every evening.    Marland Kitchen. liothyronine (CYTOMEL) 25 MCG tablet Take 12.5 mcg by mouth daily.  . cyclobenzaprine (FLEXERIL) 5 MG tablet Take 5 mg by mouth 3 (three) times daily as needed. For muscle spasms  . dextrose 5 % SOLN 50 mL with cefTRIAXone 2 G SOLR 2 g Inject 2 g into the vein every 12 (twelve) hours.  . ondansetron (ZOFRAN ODT) 4 MG disintegrating tablet Take 1 tablet (4 mg total) by mouth every 8 (eight) hours as needed for nausea or vomiting.  . [DISCONTINUED] oxyCODONE-acetaminophen (PERCOCET) 5-325 MG tablet Take 1 tablet by mouth every 4 (four) hours as needed for severe pain.  . [DISCONTINUED] traMADol (ULTRAM) 50 MG  tablet Take 50 mg by mouth every 6 (six) hours as needed. For pain   No facility-administered encounter medications on file as of 07/05/2019.     Allergies as of 07/05/2019 - Review Complete 07/05/2019  Allergen Reaction Noted  . Tetracyclines & related Anaphylaxis 06/11/2011  . Codeine Nausea And Vomiting 01/22/2012    Past Medical History:  Diagnosis Date  . Bronchitis   . Hypotension   . Meningitis    01/2012  . Pneumonia   . Thyroid disorder     Past Surgical History:  Procedure Laterality Date  . ELBOW SURGERY     Right  . PAROTIDECTOMY    . TONSILLECTOMY      History reviewed. No pertinent family history.  Social History   Socioeconomic History  . Marital status: Legally Separated    Spouse name: Not on file  . Number of children: Not on file  . Years of education: Not on file  . Highest education level: Not on file  Occupational History  . Not on file  Social Needs  . Financial resource strain: Not on file  . Food insecurity    Worry: Not on file    Inability: Not on file  . Transportation needs    Medical: Not on file    Non-medical: Not on file  Tobacco Use  . Smoking status: Current Every Day Smoker    Packs/day: 0.50    Years: 15.00    Pack years:  7.50    Types: Cigarettes  . Smokeless tobacco: Never Used  Substance and Sexual Activity  . Alcohol use: Yes  . Drug use: No  . Sexual activity: Yes    Birth control/protection: None  Lifestyle  . Physical activity    Days per week: Not on file    Minutes per session: Not on file  . Stress: Not on file  Relationships  . Social Herbalist on phone: Not on file    Gets together: Not on file    Attends religious service: Not on file    Active member of club or organization: Not on file    Attends meetings of clubs or organizations: Not on file    Relationship status: Not on file  . Intimate partner violence    Fear of current or ex partner: Not on file    Emotionally abused: Not on  file    Physically abused: Not on file    Forced sexual activity: Not on file  Other Topics Concern  . Not on file  Social History Narrative  . Not on file      Review of systems: Review of Systems  Constitutional: Negative for fever and chills.  HENT: Negative.   Eyes: Negative for blurred vision.  Respiratory: Negative for cough, shortness of breath and wheezing.   Cardiovascular: Negative for chest pain and palpitations.  Gastrointestinal: as per HPI Genitourinary: Negative for dysuria, urgency, frequency and hematuria.  Musculoskeletal: Positive for myalgias, back pain and joint pain.  Skin: Negative for itching and rash.  Neurological: Negative for dizziness, tremors, focal weakness, seizures and loss of consciousness.  Endo/Heme/Allergies: Positive for seasonal allergies.  Psychiatric/Behavioral: Negative for depression, suicidal ideas and hallucinations.  All other systems reviewed and are negative.   Physical Exam: Vitals:   07/05/19 1003  BP: 98/62  Pulse: 68  Temp: 98.6 F (37 C)   Body mass index is 19.48 kg/m. Gen:      No acute distress HEENT:  EOMI, sclera anicteric Neck:     No masses; no thyromegaly Lungs:    Clear to auscultation bilaterally; normal respiratory effort CV:         Regular rate and rhythm; no murmurs Abd:      + bowel sounds; soft, non-tender; no palpable masses, no distension Ext:    No edema; adequate peripheral perfusion Skin:      Warm and dry; no rash Neuro: alert and oriented x 3 Psych: normal mood and affect  Data Reviewed:  Reviewed labs, radiology imaging, old records and pertinent past GI work up   Assessment and Plan/Recommendations: 58 year old female with history of chronic constipation, periumbilical abdominal pain, CT suggestive of proximal jejunal intussusception Abdominal pain is improving Will obtain small bowel follow-through to exclude any small bowel mucosal polyps/neoplastic lesion or stricture.   Constipation: Plan for bowel purge with MiraLAX Start MiraLAX half capful daily after the bowel purge Benefiber 1 tablespoon twice daily  Will obtain records of prior colonoscopy from Dr. Watt Climes and Dr. Jonna Munro office Due for recall colonoscopy September 2023  Return in 3 months or sooner if needed  K. Denzil Magnuson , MD    CC: Briscoe Deutscher, MD

## 2019-07-06 ENCOUNTER — Other Ambulatory Visit: Payer: Self-pay

## 2019-07-06 ENCOUNTER — Telehealth: Payer: Self-pay | Admitting: Gastroenterology

## 2019-07-06 NOTE — Telephone Encounter (Signed)
Pt states that she has been following Dr. Woodward Ku recommendation for her constipation, she began taking miralax this morning but she said that she has not had a bm yet and she does not know if she should keep trying miralax. Pls call her.

## 2019-07-07 ENCOUNTER — Other Ambulatory Visit: Payer: Self-pay

## 2019-07-07 ENCOUNTER — Ambulatory Visit (HOSPITAL_COMMUNITY)
Admission: RE | Admit: 2019-07-07 | Discharge: 2019-07-07 | Disposition: A | Discharge status: Home / Self Care | Payer: Managed Care, Other (non HMO) | Source: Ambulatory Visit | Attending: Gastroenterology | Admitting: Gastroenterology

## 2019-07-07 DIAGNOSIS — K5909 Other constipation: Secondary | ICD-10-CM | POA: Insufficient documentation

## 2019-07-07 DIAGNOSIS — R109 Unspecified abdominal pain: Secondary | ICD-10-CM

## 2019-07-07 NOTE — Telephone Encounter (Signed)
Patient was given written instructions for a bowel purge using Miralax and Bisacodyl. She is presently scheduled for her small bowel follow through at 11:00 am today.

## 2019-07-13 ENCOUNTER — Telehealth: Payer: Self-pay | Admitting: Gastroenterology

## 2019-07-13 NOTE — Telephone Encounter (Signed)
See the imaging results.

## 2019-11-25 IMAGING — RF DG SMALL BOWEL
14 of 17 series · 14 of 19 positions shown · non-contrast
Comparison: CT dated 06/30/2019

CLINICAL DATA: History intussusception in the jejunum on CT dated
06/30/2019.

EXAM:
SMALL BOWEL SERIES
TECHNIQUE: Following ingestion of thin barium, serial small bowel images were
obtained including spot views of the proximal jejunum and terminal
ileum.
FLUOROSCOPY TIME:  Fluoroscopy Time:  48 seconds
Radiation Exposure Index (if provided by the fluoroscopic device):
9.1 mGy
Number of Acquired Spot Images: 7

[Series 1: fluoro_barium 2fps_bw · 0.18mm/px · 1 of 1 slices shown (1 of 4)]
[im 1/1]
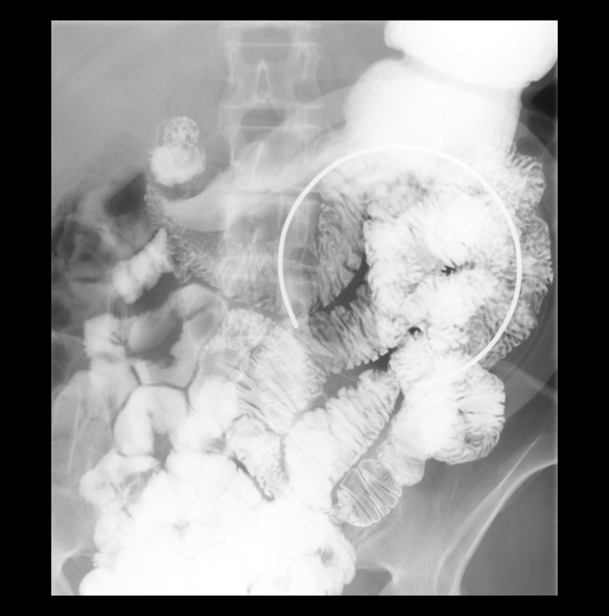

[Series 3: cp_standard · 0.27mm/px · 1 of 1 slices shown (1 of 10)]
[im 1/1]
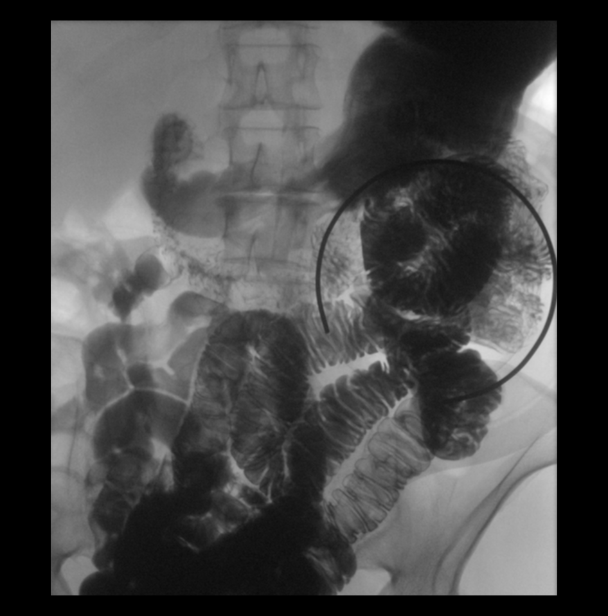

[Series 4: cp_standard · 0.27mm/px · 1 of 1 slices shown (2 of 10)]
[im 1/1]
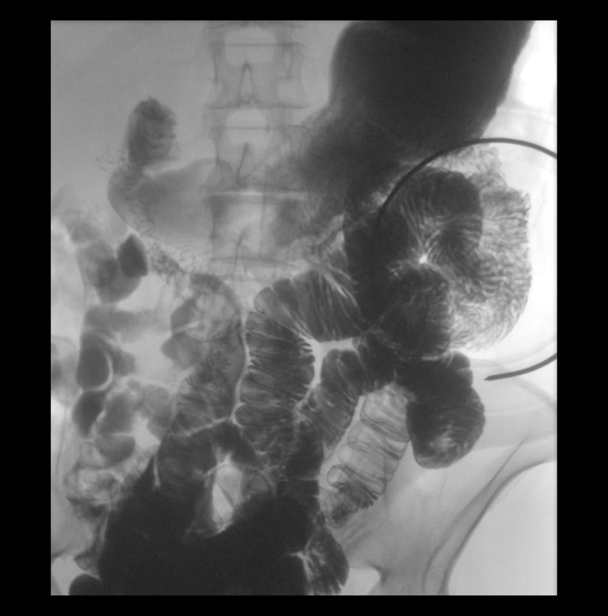

[Series 5: cp_standard · 0.27mm/px · 1 of 1 slices shown (3 of 10)]
[im 1/1]
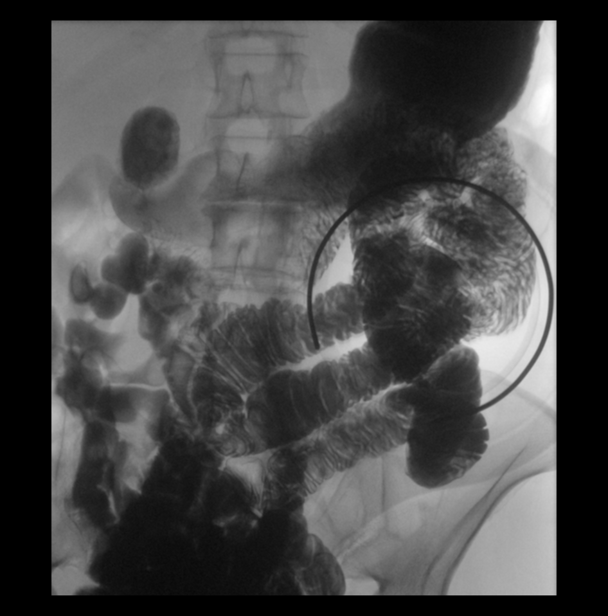

[Series 7: cp_standard · 0.27mm/px · 1 of 1 slices shown (4 of 10)]
[im 1/1]
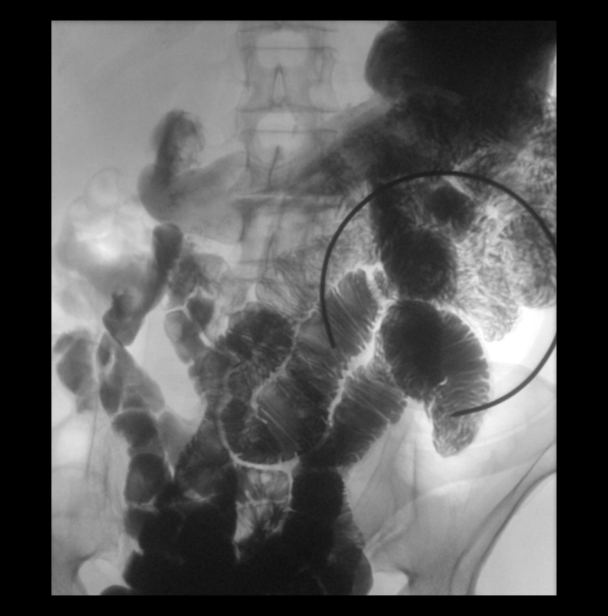

[Series 8: cp_standard · 0.27mm/px · 1 of 1 slices shown (5 of 10)]
[im 1/1]
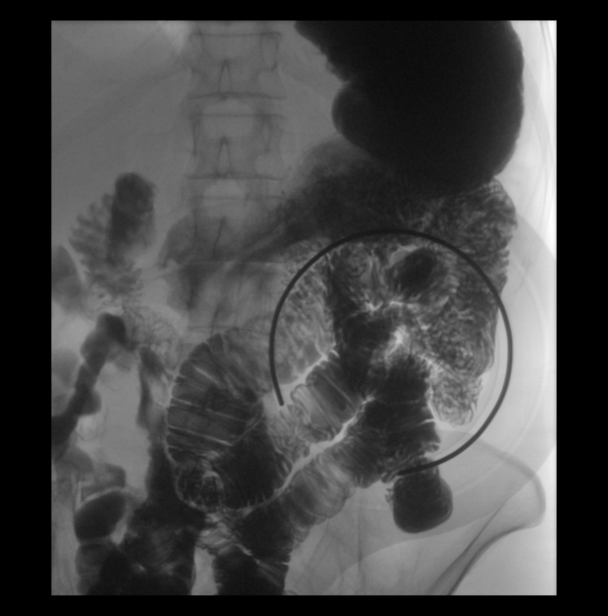

[Series 9: cp_standard · 0.27mm/px · 1 of 1 slices shown (6 of 10)]
[im 1/1]
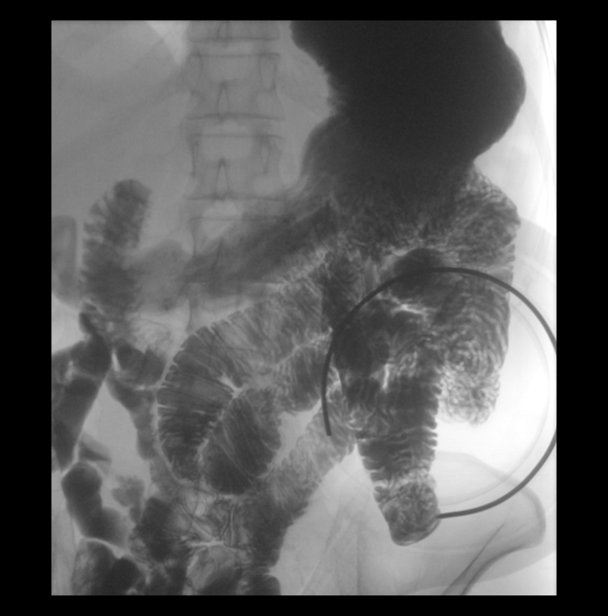

[Series 11: cp_standard · 0.27mm/px · 1 of 1 slices shown (7 of 10)]
[im 1/1]
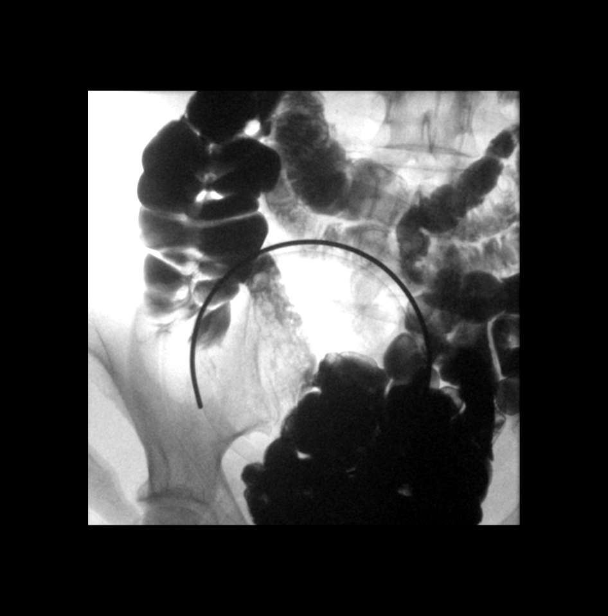

[Series 12: cp_standard · 0.27mm/px · 1 of 1 slices shown (8 of 10)]
[im 1/1]
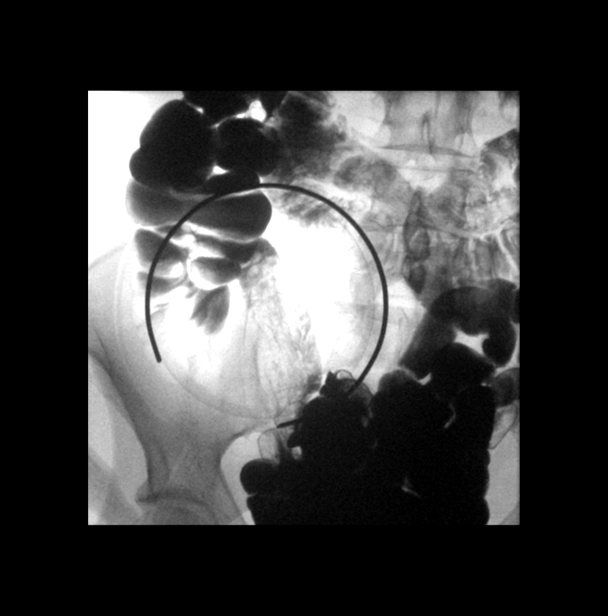

[Series 13: fluoro_barium 2fps_bw · 0.18mm/px · 1 of 1 slices shown (2 of 4)]
[im 1/1]
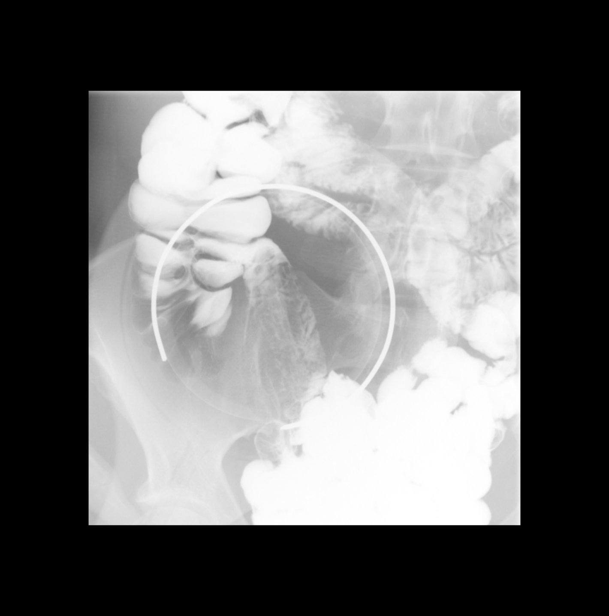

[Series 14: fluoro_barium 2fps_bw · 0.18mm/px · 1 of 2 frames shown (3 of 4)]
[frame 2/2]
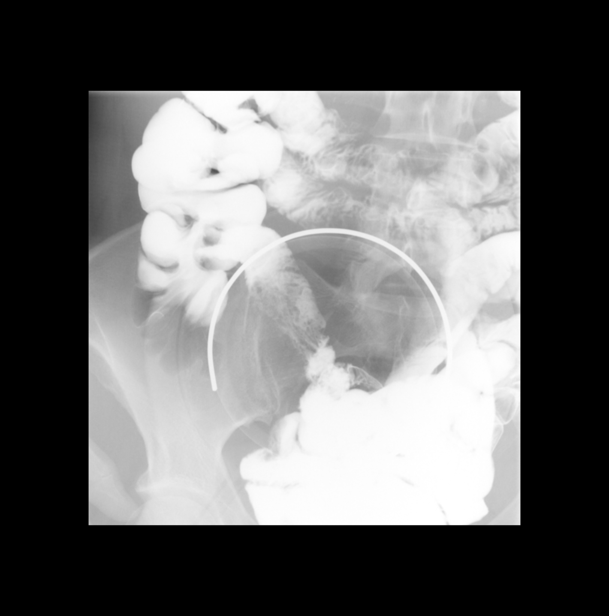

[Series 15: cp_standard · 0.27mm/px · 1 of 1 slices shown (9 of 10)]
[im 1/1]
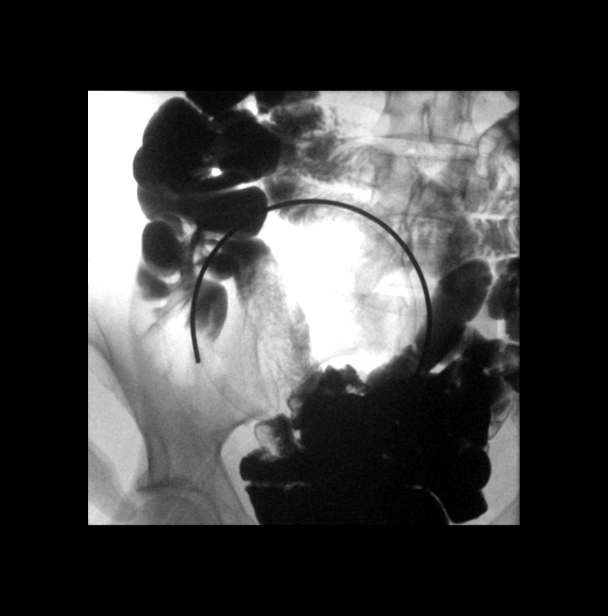

[Series 16: fluoro_barium 2fps_bw · 0.18mm/px · 1 of 2 frames shown (4 of 4)]
[frame 1/2]
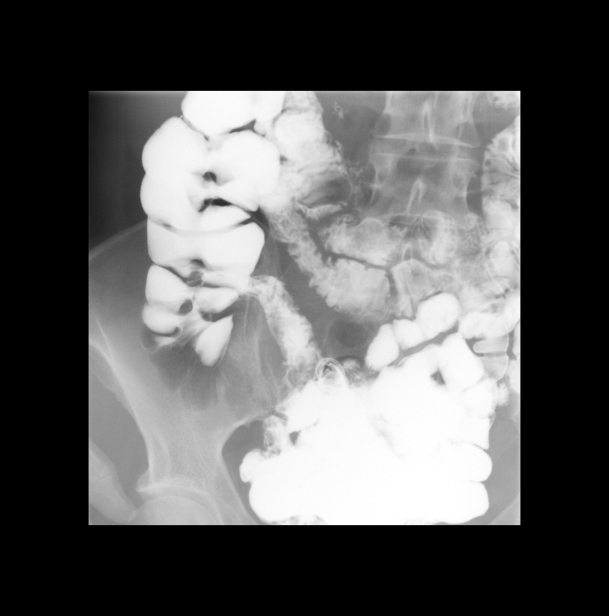

[Series 17: cp_standard · 0.27mm/px · 1 of 1 slices shown (10 of 10)]
[im 1/1]
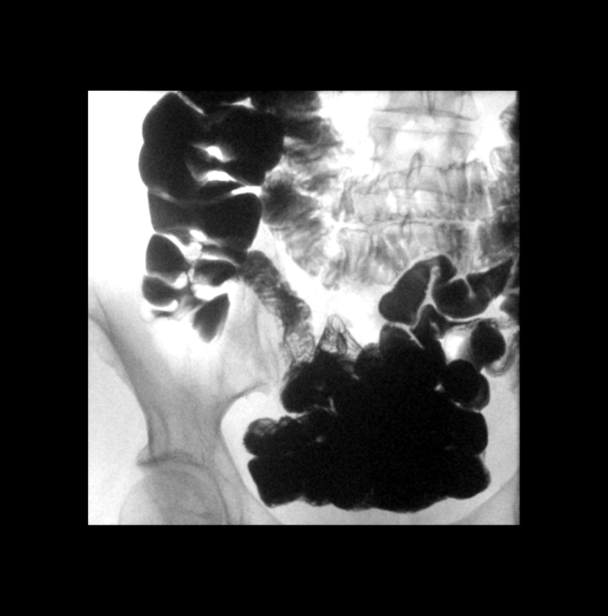

[14 of 19 positions shown; findings below may reference images not displayed]

FINDINGS: The small bowel is normal in course and caliber, and the terminal
ileum was well visualized in the right lower quadrant. Compression
views were obtained in the left upper quadrant of the proximal
jejunum at the site of prior intussusception. No narrowing or
luminal abnormality is identified. No definite extraluminal mass is
identified. Normal small bowel fold pattern. Normal peristalsis.
Transit time to the colon was normal at less than 1 hour.
IMPRESSION: Normal small-bowel follow-through. No evidence of the patient's
previously documented intussusception in the jejunum.

## 2020-08-27 ENCOUNTER — Ambulatory Visit: Payer: Managed Care, Other (non HMO) | Admitting: Nurse Practitioner

## 2020-09-05 ENCOUNTER — Telehealth: Payer: Self-pay | Admitting: Gastroenterology

## 2020-09-05 NOTE — Telephone Encounter (Signed)
Pt called requesting a sooner appt than 12/2. She stated to have been having diarrhea with mucus for over a month, she just had c-diff test with PCP that was negative. PCP is requesting to move pt up her appt due to pt's past hx with c-diff. Pls call her.

## 2020-09-06 NOTE — Telephone Encounter (Signed)
Appointment moved to 09/09/20. Patient aware.

## 2020-09-09 ENCOUNTER — Encounter: Payer: Self-pay | Admitting: Physician Assistant

## 2020-09-09 ENCOUNTER — Ambulatory Visit (INDEPENDENT_AMBULATORY_CARE_PROVIDER_SITE_OTHER): Payer: Managed Care, Other (non HMO) | Admitting: Physician Assistant

## 2020-09-09 ENCOUNTER — Other Ambulatory Visit (INDEPENDENT_AMBULATORY_CARE_PROVIDER_SITE_OTHER): Payer: Managed Care, Other (non HMO)

## 2020-09-09 VITALS — BP 90/60 | HR 88 | Ht 63.5 in | Wt 116.4 lb

## 2020-09-09 DIAGNOSIS — R1084 Generalized abdominal pain: Secondary | ICD-10-CM

## 2020-09-09 DIAGNOSIS — R194 Change in bowel habit: Secondary | ICD-10-CM | POA: Diagnosis not present

## 2020-09-09 DIAGNOSIS — K921 Melena: Secondary | ICD-10-CM

## 2020-09-09 DIAGNOSIS — R197 Diarrhea, unspecified: Secondary | ICD-10-CM

## 2020-09-09 LAB — COMPREHENSIVE METABOLIC PANEL
ALT: 7 U/L (ref 0–35)
AST: 15 U/L (ref 0–37)
Albumin: 4.8 g/dL (ref 3.5–5.2)
Alkaline Phosphatase: 63 U/L (ref 39–117)
BUN: 9 mg/dL (ref 6–23)
CO2: 24 mEq/L (ref 19–32)
Calcium: 10.1 mg/dL (ref 8.4–10.5)
Chloride: 101 mEq/L (ref 96–112)
Creatinine, Ser: 0.69 mg/dL (ref 0.40–1.20)
GFR: 96.46 mL/min (ref >60.00)
Glucose, Bld: 79 mg/dL (ref 70–99)
Potassium: 3.8 mEq/L (ref 3.5–5.1)
Sodium: 136 mEq/L (ref 135–145)
Total Bilirubin: 0.5 mg/dL (ref 0.2–1.2)
Total Protein: 7.5 g/dL (ref 6.0–8.3)

## 2020-09-09 LAB — CBC WITH DIFFERENTIAL/PLATELET
Basophils Absolute: 0.1 10*3/uL (ref 0.0–0.1)
Basophils Relative: 1.2 % (ref 0.0–3.0)
Eosinophils Absolute: 0.4 10*3/uL (ref 0.0–0.7)
Eosinophils Relative: 4.2 % (ref 0.0–5.0)
HCT: 45 % (ref 36.0–46.0)
Hemoglobin: 15.4 g/dL — ABNORMAL HIGH (ref 12.0–15.0)
Lymphocytes Relative: 27.4 % (ref 12.0–46.0)
Lymphs Abs: 2.9 10*3/uL (ref 0.7–4.0)
MCHC: 34.3 g/dL (ref 30.0–36.0)
MCV: 90.9 fl (ref 78.0–100.0)
Monocytes Absolute: 0.8 10*3/uL (ref 0.1–1.0)
Monocytes Relative: 7.3 % (ref 3.0–12.0)
Neutro Abs: 6.3 10*3/uL (ref 1.4–7.7)
Neutrophils Relative %: 59.9 % (ref 43.0–77.0)
Platelets: 330 10*3/uL (ref 150.0–400.0)
RBC: 4.95 Mil/uL (ref 3.87–5.11)
RDW: 13.9 % (ref 11.5–15.5)
WBC: 10.5 10*3/uL (ref 4.0–10.5)

## 2020-09-09 NOTE — Patient Instructions (Addendum)
If you are age 57 or older, your body mass index should be between 23-30. Your Body mass index is 20.29 kg/m. If this is out of the aforementioned range listed, please consider follow up with your Primary Care Provider.  If you are age 7 or younger, your body mass index should be between 19-25. Your Body mass index is 20.29 kg/m. If this is out of the aformentioned range listed, please consider follow up with your Primary Care Provider.   Your provider has requested that you go to the basement level for lab work before leaving today. Press "B" on the elevator. The lab is located at the first door on the left as you exit the elevator.  You have been scheduled for a CT scan of the abdomen and pelvis at [facility] ([address]---this is in the same building as [employer]).   You are scheduled on Thursday 09/19/20 at 3 pm. You should arrive 15 minutes prior to your appointment time for registration. Please follow the written instructions below on the day of your exam:  WARNING: IF YOU ARE ALLERGIC TO IODINE/X-RAY DYE, PLEASE NOTIFY RADIOLOGY IMMEDIATELY AT [phone]! YOU WILL BE GIVEN A 13 HOUR PREMEDICATION PREP.  1) Do not eat or drink anything after 11 am (4 hours prior to your test) 2) You have been given 2 bottles of oral contrast to drink. The solution may taste better if refrigerated, but do NOT add ice or any other liquid to this solution. Shake well before drinking.    Drink 1 bottle of contrast @ 1 pm (2 hours prior to your exam)  Drink 1 bottle of contrast @ 2 pm (1 hour prior to your exam)  You may take any medications as prescribed with a small amount of water, if necessary. If you take any of the following medications: METFORMIN, GLUCOPHAGE, GLUCOVANCE, AVANDAMET, RIOMET, FORTAMET, Arjay MET, JANUMET, GLUMETZA or METAGLIP, you MAY be asked to HOLD this medication 48 hours AFTER the exam.  The purpose of you drinking the oral contrast is to aid in  the visualization of your intestinal tract. The contrast solution may cause some diarrhea. Depending on your individual set of symptoms, you may also receive an intravenous injection of x-ray contrast/dye. Plan on being at [facility] for 30 minutes or longer, depending on the type of exam you are having performed.  This test typically takes 30-45 minutes to complete.  If you have any questions regarding your exam or if you need to reschedule, you may call the CT department at [phone] between the hours of 8:00 am and 5:00 pm, Monday-Friday.  _______________________________________________________________

## 2020-09-09 NOTE — Progress Notes (Signed)
Chief Complaint: Diarrhea with mucus  HPI:    Tracey Flowers is a 59 year old female with a past medical history as listed below, known to Dr. Lavon Paganini, who was referred to me by Tracey Elk, MD for a complaint of diarrhea with mucus.      07/05/2019 patient saw Dr. Lavon Paganini in office for abdominal pain, constipation and bloating.  At that time discussed she had a CT of the abdomen in September 2020 that showed findings suggestive of proximal jejunum intussusception.  Her symptoms had improved over the past few days.  Described a colonoscopy September 2013 which was normal per the patient with Dr. Opal Sidles at [facility] gastroenterology.  A small bowel follow-through was ordered and it was recommended she do a bowel purge with MiraLAX.  Discussed she is due for recall colonoscopy September 2023.    07/07/2019 normal small bowel follow-through.    09/05/2020 patient called and described having diarrhea with mucus for over a month and had a C. difficile test with her PCP that was negative.    Today, the patient presents to clinic and tells me that 7 weeks ago she started with diarrhea and mucus, initially had some bright red blood mixed into this that  lasted for about a week and a half and then it turned to just mucus and then changed to a seedy "baby looking poop", that was orange.  Prior to seeing her PCP she had one brown liquid stool and now she feels like things have just kind of stopped.  She may have a "bowel movement" every other day or so but it feels like she gets abdominal cramping to have a large amount of stool and all that will come out is just mucus strands and no stool at all.  Tells me that she can count the number of times she has had a brown stool in the past 2 months on one hand.  Describes that these cramps are typically in the lower abdomen but can spread upwards.  They just seem to come and go as they please, rated as a 6-7/10.  She has really stopped her regular diet and is just staying on  soup because "this does not seem to bother me as much".  Eating meats and other things that are "hard to digest" just seem to bring on more frequent bowel movements.  If she were to eat completely regularly she would have 1 loose stool every couple of hours.  Describes that she is able to sleep at night.    Denies fever, chills or significant weight loss, changes in diet or eating anything a regular prior to symptom onset or being around anyone else sick.  Past Medical History:  Diagnosis Date  . Bronchitis   . Chronic constipation   . Hypotension   . Intussusception of jejunum (HCC)   . Meningitis    01/2012  . Pneumonia   . Thyroid disorder     Past Surgical History:  Procedure Laterality Date  . ELBOW SURGERY     Right  . PAROTIDECTOMY    . TONSILLECTOMY      Current Outpatient Medications  Medication Sig Dispense Refill  . Cholecalciferol (VITAMIN D) 2000 UNITS tablet Take 2,000 Units by mouth daily.    Marland Kitchen levothyroxine (SYNTHROID, LEVOTHROID) 125 MCG tablet Take 125 mcg by mouth every evening.      Marland Kitchen liothyronine (CYTOMEL) 25 MCG tablet Take 12.5 mcg by mouth daily.     No current facility-administered medications for this  visit.    Allergies as of 09/09/2020 - Review Complete 07/05/2019  Allergen Reaction Noted  . Tetracyclines & related Anaphylaxis 06/11/2011  . Codeine Nausea And Vomiting 01/22/2012    No family history on file.  Social History   Socioeconomic History  . Marital status: Legally Separated    Spouse name: Not on file  . Number of children: Not on file  . Years of education: Not on file  . Highest education level: Not on file  Occupational History  . Not on file  Tobacco Use  . Smoking status: Current Every Day Smoker    Packs/day: 0.50    Years: 15.00    Pack years: 7.50    Types: Cigarettes  . Smokeless tobacco: Never Used  Substance and Sexual Activity  . Alcohol use: Yes  . Drug use: No  . Sexual activity: Yes    Birth  control/protection: None  Other Topics Concern  . Not on file  Social History Narrative  . Not on file   Social Determinants of Health   Financial Resource Strain:   . Difficulty of Paying Living Expenses: Not on file  Food Insecurity:   . Worried About Programme researcher, broadcasting/film/video in the Last Year: Not on file  . Ran Out of Food in the Last Year: Not on file  Transportation Needs:   . Lack of Transportation (Medical): Not on file  . Lack of Transportation (Non-Medical): Not on file  Physical Activity:   . Days of Exercise per Week: Not on file  . Minutes of Exercise per Session: Not on file  Stress:   . Feeling of Stress : Not on file  Social Connections:   . Frequency of Communication with Friends and Family: Not on file  . Frequency of Social Gatherings with Friends and Family: Not on file  . Attends Religious Services: Not on file  . Active Member of Clubs or Organizations: Not on file  . Attends Banker Meetings: Not on file  . Marital Status: Not on file  Intimate Partner Violence:   . Fear of Current or Ex-Partner: Not on file  . Emotionally Abused: Not on file  . Physically Abused: Not on file  . Sexually Abused: Not on file    Review of Systems:    Constitutional: No weight loss, fever or chills Cardiovascular: No chest pain Respiratory: No SOB  Gastrointestinal: See HPI and otherwise negative   Physical Exam:  Vital signs: Ht 5' 3.5" (1.613 m) Comment: height measured without shoes  Wt 116 lb 6 oz (52.8 kg)   LMP 01/04/2014   BMI 20.29 kg/m   Constitutional:   Pleasant Caucasian female appears to be in NAD, Well developed, Well nourished, alert and cooperative Head:  Normocephalic and atraumatic. Eyes:   PEERL, EOMI. No icterus. Conjunctiva pink. Ears:  Normal auditory acuity. Neck:  Supple Throat: Oral cavity and pharynx without inflammation, swelling or lesion.  Respiratory: Respirations even and unlabored. Lungs clear to auscultation  bilaterally.   No wheezes, crackles, or rhonchi.  Cardiovascular: Normal S1, S2. No MRG. Regular rate and rhythm. No peripheral edema, cyanosis or pallor.  Gastrointestinal:  Soft, nondistended, moderate lower abdominal ttp with involuntary guarding, Normal bowel sounds. No appreciable masses or hepatomegaly. Rectal:  Not performed.  Msk:  Symmetrical without gross deformities. Without edema, no deformity or joint abnormality.  Neurologic:  Alert and  oriented x4;  grossly normal neurologically.  Skin:   Dry and intact without significant lesions or  rashes. Psychiatric:  Demonstrates good judgement and reason without abnormal affect or behaviors.  No recent labs or imaging.  Assessment: 1.  Generalized abdominal pain: Worse in the lower abdomen, described as cramping, no relief after a bowel movement, for 7 weeks with change in stools; consider colitis versus other 2.  Change in bowel habits to diarrhea: For 7 weeks now, now just passing mostly mucus and liquid stool, C. difficile negative per patient (requesting this lab from PCP); consider bacteria versus virus versus IBD versus other 3.  Hematochezia: Did see some bright red blood at first 7 weeks ago, this is stopped over the past 6 weeks or so  Plan: 1.  Ordered a GI pathogen panel for the patient. 2.  Requested recent stool studies from PCP. 3.  Ordered a CBC and CMP today. 4.  Ordered a CT of the abdomen pelvis with contrast for further evaluation of continued abdominal pain and change in bowel habits 5.  Pending results from the above we may need to proceed with colonoscopy for further evaluation. 6.  Patient to follow in clinic per recommendations after labs and imaging above.  Hyacinth Meeker, PA-C [facility] 09/09/2020, 3:31 PM  Cc: Tracey Elk, MD

## 2020-09-10 ENCOUNTER — Telehealth: Payer: Self-pay | Admitting: Physician Assistant

## 2020-09-10 ENCOUNTER — Other Ambulatory Visit: Payer: Managed Care, Other (non HMO)

## 2020-09-10 DIAGNOSIS — R194 Change in bowel habit: Secondary | ICD-10-CM

## 2020-09-10 DIAGNOSIS — R1084 Generalized abdominal pain: Secondary | ICD-10-CM

## 2020-09-10 DIAGNOSIS — R197 Diarrhea, unspecified: Secondary | ICD-10-CM

## 2020-09-10 NOTE — Telephone Encounter (Signed)
This needs to go to the CMA.  Herbert Seta was with Victorino Dike in the office yesterday.

## 2020-09-10 NOTE — Telephone Encounter (Signed)
Pt is requesting a call back from a nurse to discuss the codes for the Pathogen Panel that Hyacinth Meeker ordered for her yesterday to see if the insurance will cover.

## 2020-09-10 NOTE — Telephone Encounter (Signed)
Spoke with patient and she will drop of stool sample and Victorino Dike said we appeal the insurance if they deny it.

## 2020-09-15 LAB — GI PROFILE, STOOL, PCR

## 2020-09-16 ENCOUNTER — Other Ambulatory Visit: Payer: Self-pay

## 2020-09-16 MED ORDER — VANCOMYCIN HCL 125 MG PO CAPS: 125.0000 mg | ORAL_CAPSULE | Freq: Four times a day (QID) | ORAL | 0 refills | Status: AC

## 2020-09-19 ENCOUNTER — Inpatient Hospital Stay: Admission: RE | Admit: 2020-09-19 | Payer: Managed Care, Other (non HMO) | Source: Ambulatory Visit

## 2020-09-19 ENCOUNTER — Ambulatory Visit: Payer: Managed Care, Other (non HMO) | Admitting: Nurse Practitioner

## 2020-09-20 ENCOUNTER — Telehealth: Payer: Self-pay | Admitting: Physician Assistant

## 2020-09-20 NOTE — Telephone Encounter (Signed)
The pt has been on abx for C diff since 11/30 and is planning to have foot surgery 12/14.  She wanted to know if she should proceed with surgery. I asked her to call her surgeon and let them know about the diagnosis and follow there recommendations.  The pt has been advised of the information and verbalized understanding.

## 2020-09-24 NOTE — Progress Notes (Signed)
Reviewed and agree with documentation and assessment and plan. K. Veena Vasil Juhasz , MD   

## 2020-09-30 ENCOUNTER — Encounter: Payer: Self-pay | Admitting: Physician Assistant

## 2020-09-30 ENCOUNTER — Ambulatory Visit (INDEPENDENT_AMBULATORY_CARE_PROVIDER_SITE_OTHER): Payer: Managed Care, Other (non HMO) | Admitting: Physician Assistant

## 2020-09-30 VITALS — BP 115/68 | HR 59 | Ht 63.5 in | Wt 117.0 lb

## 2020-09-30 DIAGNOSIS — A0472 Enterocolitis due to Clostridium difficile, not specified as recurrent: Secondary | ICD-10-CM

## 2020-09-30 MED ORDER — VANCOMYCIN HCL 125 MG PO CAPS: ORAL_CAPSULE | ORAL | 0 refills | Status: AC

## 2020-09-30 NOTE — Progress Notes (Addendum)
Chief Complaint: Follow-up diarrhea  HPI:    Tracey Flowers is a 58 year old female with past medical history as listed below, known to Dr. Lavon Paganini, who returns to clinic today for follow-up of her diarrhea.     07/05/2019 patient saw Dr. Lavon Paganini in office for abdominal pain, constipation and bloating.  At that time discussed she had a CT of the abdomen in September 2020 that showed findings suggestive of proximal jejunum intussusception.  Her symptoms had improved over the past few days.  Described a colonoscopy September 2013 which was normal per the patient with Dr. Opal Sidles at [facility] gastroenterology.  A small bowel follow-through was ordered and it was recommended she do a bowel purge with MiraLAX.  Discussed she is due for recall colonoscopy September 2023.    07/07/2019 normal small bowel follow-through.    09/05/2020 patient called and described having diarrhea with mucus for over a month and had a C. difficile test with her PCP that was negative.    09/09/2020 patient described that 7 weeks ago she had started with diarrhea and mucus initially had some bright red blood mixed in.  Told me that she count the number of times she had a brown stool in the past 2 months on 1 hand, also had lower abdominal cramping.  At that time ordered a GI pathogen panel CBC and CMP.  Also CT of the abdomen pelvis with contrast.    09/10/2020 patient had C. difficile toxin a and B detected in her stool.  She was started on vancomycin 125 mg p.o. every 6 hours x10 days.  (We did not do the CT due to these results)    Today, the patient presents to clinic and tells me that she is about 80 to 90% better but not completely normal.  She is no longer seeing "sea creatures" in her feces but tells me that she has at least 5-6 episodes of loose diarrhea that is a normal brown color on a typical day.  Occasionally she will have days when it is slightly better but even this morning she has already had 3 bowel movements before  leaving the house.  Tells me this is no different when she was actually taking the antibiotics which she finished around 09/26/2020.  Still has some lower abdominal cramping.    Denies fever, chills or blood in her stool.  Past Medical History:  Diagnosis Date   Bronchitis    Chronic constipation    Hypotension    Intussusception of jejunum (HCC)    Meningitis    01/2012   Pneumonia    Thyroid disorder     Past Surgical History:  Procedure Laterality Date   ELBOW SURGERY     Right   PAROTIDECTOMY     TONSILLECTOMY      Current Outpatient Medications  Medication Sig Dispense Refill   Boric Acid Vaginal 600 MG SUPP Place 1 suppository vaginally as needed (twice a week).      Cholecalciferol (VITAMIN D) 2000 UNITS tablet Take 2,000 Units by mouth daily.     estradiol (ESTRACE) 0.1 MG/GM vaginal cream Place 1 Applicatorful vaginally as needed (twice a week).      levothyroxine (SYNTHROID, LEVOTHROID) 125 MCG tablet Take 125 mcg by mouth every evening.       liothyronine (CYTOMEL) 25 MCG tablet Take 12.5 mcg by mouth daily.     No current facility-administered medications for this visit.    Allergies as of 09/30/2020 - Review Complete 09/09/2020  Allergen Reaction Noted   Tetracyclines & related Anaphylaxis 06/11/2011   Codeine Nausea And Vomiting 01/22/2012    No family history on file.  Social History   Socioeconomic History   Marital status: Legally Separated    Spouse name: Not on file   Number of children: Not on file   Years of education: Not on file   Highest education level: Not on file  Occupational History   Not on file  Tobacco Use   Smoking status: Current Every Day Smoker    Packs/day: 0.50    Years: 15.00    Pack years: 7.50    Types: Cigarettes   Smokeless tobacco: Never Used  Substance and Sexual Activity   Alcohol use: Yes   Drug use: No   Sexual activity: Yes    Birth control/protection: None  Other Topics Concern    Not on file  Social History Narrative   Not on file   Social Determinants of Health   Financial Resource Strain: Not on file  Food Insecurity: Not on file  Transportation Needs: Not on file  Physical Activity: Not on file  Stress: Not on file  Social Connections: Not on file  Intimate Partner Violence: Not on file    Review of Systems:    Constitutional: No weight loss, fever or chills Cardiovascular: No chest pain  Respiratory: No SOB Gastrointestinal: See HPI and otherwise negative   Physical Exam:  Vital signs: BP 115/68    Pulse (!) 59    Ht 5' 3.5" (1.613 m)    Wt 117 lb (53.1 kg)    LMP 01/04/2014    BMI 20.40 kg/m  Constitutional:   Pleasant Caucasian female appears to be in NAD, Well developed, Well nourished, alert and cooperative  Respiratory: Respirations even and unlabored. Lungs clear to auscultation bilaterally.   No wheezes, crackles, or rhonchi.  Cardiovascular: Normal S1, S2. No MRG. Regular rate and rhythm. No peripheral edema, cyanosis or pallor.  Gastrointestinal:  Soft, nondistended, nontender. No rebound or guarding. Normal bowel sounds. No appreciable masses or hepatomegaly. Rectal:  Not performed.  Psychiatric: Demonstrates good judgement and reason without abnormal affect or behaviors.  RELEVANT LABS AND IMAGING: CBC    Component Value Date/Time   WBC 10.5 09/09/2020 1620   RBC 4.95 09/09/2020 1620   HGB 15.4 (H) 09/09/2020 1620   HCT 45.0 09/09/2020 1620   PLT 330.0 09/09/2020 1620   MCV 90.9 09/09/2020 1620   MCH 30.2 06/30/2019 1019   MCHC 34.3 09/09/2020 1620   RDW 13.9 09/09/2020 1620   LYMPHSABS 2.9 09/09/2020 1620   MONOABS 0.8 09/09/2020 1620   EOSABS 0.4 09/09/2020 1620   BASOSABS 0.1 09/09/2020 1620    CMP     Component Value Date/Time   NA 136 09/09/2020 1620   K 3.8 09/09/2020 1620   CL 101 09/09/2020 1620   CO2 24 09/09/2020 1620   GLUCOSE 79 09/09/2020 1620   BUN 9 09/09/2020 1620   CREATININE 0.69 09/09/2020 1620    CALCIUM 10.1 09/09/2020 1620   PROT 7.5 09/09/2020 1620   ALBUMIN 4.8 09/09/2020 1620   AST 15 09/09/2020 1620   ALT 7 09/09/2020 1620   ALKPHOS 63 09/09/2020 1620   BILITOT 0.5 09/09/2020 1620   GFRNONAA >60 06/30/2019 1019   GFRAA >60 06/30/2019 1019    Assessment: 1.  C. difficile diarrhea: Finished 14 days of Vancomycin 125 mg p.o. 4 times daily, symptoms are 80-90% better but not gone  Plan: 1.  Start the patient on a prolonged Vancomycin taper today.  125 mg p.o. every 6 hours x14 days, then 125 mg p.o. every 12 hours x7 days, then 125 mg p.o. daily x7 days, then 125 mg p.o. every other day x4 weeks. 2.  Patient will call and let us know how she is doing after taper above.  If his symptoms are better we do not need to see her back in clinic.  She continues with diarrhea she can call and make an appointment.  Hyacinth Meeker, PA-C [facility] 09/30/2020, 9:02 AM  Cc: Marinda Elk, MD   Addendum: 10/02/2020 1450 Received records: 08/28/2020 C. difficile toxins a and B, EIA negative 09/07/2019 CBC normal, CMP  Interestingly C. difficile was negative in November and positive when we checked it in December.  She is feeling better after treatment for this.  No change to current recommendations  Hyacinth Meeker, PA-C

## 2020-09-30 NOTE — Patient Instructions (Signed)
If you are age 57 or older, your body mass index should be between 23-30. Your Body mass index is 20.4 kg/m. If this is out of the aforementioned range listed, please consider follow up with your Primary Care Provider.  If you are age 11 or younger, your body mass index should be between 19-25. Your Body mass index is 20.4 kg/m. If this is out of the aformentioned range listed, please consider follow up with your Primary Care Provider.   We have sent the following medications to your pharmacy for you to pick up at your convenience: Vancomycin  Thank you for choosing me and [facility].  Hyacinth Meeker, PA-C

## 2020-11-07 ENCOUNTER — Telehealth: Payer: Self-pay | Admitting: Physician Assistant

## 2020-11-07 NOTE — Telephone Encounter (Signed)
Sadly, there is nothing else we can do. I am sorry.  Hyacinth Meeker, PA-C

## 2020-11-07 NOTE — Telephone Encounter (Signed)
[employer] and spoke with Cordina to add an additional diagnosis code, added diagnosis from 09/30/2020 - C. Difficile diarrhea. Cordina states that it will take about 30-45 days for a response from the insurance, if patient is responsible for anything after that they will receive another statement.   Lm on vm for patient to return call.

## 2020-11-07 NOTE — Telephone Encounter (Signed)
Inbound call from patient stating she received denial letter from her insurance stating they will not cover C difficile lab and wants to know what else can be done.  Please advise.

## 2020-11-07 NOTE — Telephone Encounter (Signed)
Are there any additional diagnoses codes that can be added for the GI Profile from 09/10/2020? Diagnoses that were previously used were generalized abdominal pain, change in bowel habits, and diarrhea, unspecified. Please advise, thank you.

## 2020-11-08 NOTE — Telephone Encounter (Signed)
Spoke with patient in regards to below. Patient will await further response from insurance within the next 30-45 days. Patient verbalized understanding and had no further concerns at the end of the call.

## 2020-12-16 NOTE — Progress Notes (Signed)
.  vne 

## 2021-02-03 DIAGNOSIS — L851 Acquired keratosis [keratoderma] palmaris et plantaris: Secondary | ICD-10-CM | POA: Diagnosis not present

## 2021-02-03 DIAGNOSIS — M2042 Other hammer toe(s) (acquired), left foot: Secondary | ICD-10-CM | POA: Diagnosis not present

## 2021-10-17 DIAGNOSIS — L84 Corns and callosities: Secondary | ICD-10-CM | POA: Diagnosis not present

## 2021-10-17 DIAGNOSIS — M79672 Pain in left foot: Secondary | ICD-10-CM | POA: Diagnosis not present

## 2021-10-17 DIAGNOSIS — L851 Acquired keratosis [keratoderma] palmaris et plantaris: Secondary | ICD-10-CM | POA: Diagnosis not present

## 2021-11-19 DIAGNOSIS — M7742 Metatarsalgia, left foot: Secondary | ICD-10-CM | POA: Diagnosis not present

## 2021-11-25 DIAGNOSIS — E039 Hypothyroidism, unspecified: Secondary | ICD-10-CM | POA: Diagnosis not present

## 2021-11-25 DIAGNOSIS — R300 Dysuria: Secondary | ICD-10-CM | POA: Diagnosis not present

## 2021-11-25 DIAGNOSIS — N390 Urinary tract infection, site not specified: Secondary | ICD-10-CM | POA: Diagnosis not present

## 2021-12-09 DIAGNOSIS — M7742 Metatarsalgia, left foot: Secondary | ICD-10-CM | POA: Diagnosis not present

## 2021-12-09 DIAGNOSIS — G8918 Other acute postprocedural pain: Secondary | ICD-10-CM | POA: Diagnosis not present

## 2021-12-09 DIAGNOSIS — M21622 Bunionette of left foot: Secondary | ICD-10-CM | POA: Diagnosis not present

## 2021-12-09 DIAGNOSIS — L852 Keratosis punctata (palmaris et plantaris): Secondary | ICD-10-CM | POA: Diagnosis not present

## 2022-01-22 DIAGNOSIS — M79672 Pain in left foot: Secondary | ICD-10-CM | POA: Diagnosis not present

## 2022-02-18 DIAGNOSIS — M7741 Metatarsalgia, right foot: Secondary | ICD-10-CM | POA: Diagnosis not present

## 2022-02-20 DIAGNOSIS — M7742 Metatarsalgia, left foot: Secondary | ICD-10-CM | POA: Diagnosis not present

## 2022-04-17 DIAGNOSIS — E890 Postprocedural hypothyroidism: Secondary | ICD-10-CM | POA: Diagnosis not present

## 2022-04-17 DIAGNOSIS — R519 Headache, unspecified: Secondary | ICD-10-CM | POA: Diagnosis not present

## 2022-04-17 DIAGNOSIS — Z8639 Personal history of other endocrine, nutritional and metabolic disease: Secondary | ICD-10-CM | POA: Diagnosis not present

## 2022-07-10 DIAGNOSIS — R42 Dizziness and giddiness: Secondary | ICD-10-CM | POA: Diagnosis not present

## 2022-07-10 DIAGNOSIS — Z8639 Personal history of other endocrine, nutritional and metabolic disease: Secondary | ICD-10-CM | POA: Diagnosis not present

## 2022-07-10 DIAGNOSIS — G44209 Tension-type headache, unspecified, not intractable: Secondary | ICD-10-CM | POA: Diagnosis not present

## 2022-07-10 DIAGNOSIS — E890 Postprocedural hypothyroidism: Secondary | ICD-10-CM | POA: Diagnosis not present

## 2022-08-14 DIAGNOSIS — H539 Unspecified visual disturbance: Secondary | ICD-10-CM | POA: Diagnosis not present

## 2022-08-14 DIAGNOSIS — H40052 Ocular hypertension, left eye: Secondary | ICD-10-CM | POA: Diagnosis not present

## 2022-08-14 DIAGNOSIS — H43811 Vitreous degeneration, right eye: Secondary | ICD-10-CM | POA: Diagnosis not present

## 2022-08-14 DIAGNOSIS — H2513 Age-related nuclear cataract, bilateral: Secondary | ICD-10-CM | POA: Diagnosis not present

## 2022-09-02 DIAGNOSIS — R000 Tachycardia, unspecified: Secondary | ICD-10-CM | POA: Diagnosis not present

## 2022-09-02 DIAGNOSIS — R0602 Shortness of breath: Secondary | ICD-10-CM | POA: Diagnosis not present

## 2022-09-02 DIAGNOSIS — Z136 Encounter for screening for cardiovascular disorders: Secondary | ICD-10-CM | POA: Diagnosis not present

## 2022-09-02 DIAGNOSIS — Z1329 Encounter for screening for other suspected endocrine disorder: Secondary | ICD-10-CM | POA: Diagnosis not present

## 2022-09-02 DIAGNOSIS — R29898 Other symptoms and signs involving the musculoskeletal system: Secondary | ICD-10-CM | POA: Diagnosis not present

## 2022-09-02 DIAGNOSIS — R002 Palpitations: Secondary | ICD-10-CM | POA: Diagnosis not present

## 2022-09-02 DIAGNOSIS — E890 Postprocedural hypothyroidism: Secondary | ICD-10-CM | POA: Diagnosis not present

## 2022-09-07 DIAGNOSIS — H40052 Ocular hypertension, left eye: Secondary | ICD-10-CM | POA: Diagnosis not present

## 2022-09-14 DIAGNOSIS — R002 Palpitations: Secondary | ICD-10-CM | POA: Diagnosis not present

## 2022-09-14 DIAGNOSIS — F17210 Nicotine dependence, cigarettes, uncomplicated: Secondary | ICD-10-CM | POA: Diagnosis not present

## 2022-09-14 DIAGNOSIS — R0602 Shortness of breath: Secondary | ICD-10-CM | POA: Diagnosis not present

## 2022-09-14 DIAGNOSIS — R252 Cramp and spasm: Secondary | ICD-10-CM | POA: Diagnosis not present

## 2022-09-17 DIAGNOSIS — E7800 Pure hypercholesterolemia, unspecified: Secondary | ICD-10-CM | POA: Diagnosis not present

## 2022-09-17 DIAGNOSIS — Z1329 Encounter for screening for other suspected endocrine disorder: Secondary | ICD-10-CM | POA: Diagnosis not present

## 2022-09-24 DIAGNOSIS — I6523 Occlusion and stenosis of bilateral carotid arteries: Secondary | ICD-10-CM | POA: Diagnosis not present

## 2022-09-24 DIAGNOSIS — R002 Palpitations: Secondary | ICD-10-CM | POA: Diagnosis not present

## 2022-09-24 DIAGNOSIS — G441 Vascular headache, not elsewhere classified: Secondary | ICD-10-CM | POA: Diagnosis not present

## 2022-09-24 DIAGNOSIS — R29898 Other symptoms and signs involving the musculoskeletal system: Secondary | ICD-10-CM | POA: Diagnosis not present

## 2022-09-24 DIAGNOSIS — F17210 Nicotine dependence, cigarettes, uncomplicated: Secondary | ICD-10-CM | POA: Diagnosis not present

## 2022-09-24 DIAGNOSIS — R519 Headache, unspecified: Secondary | ICD-10-CM | POA: Diagnosis not present

## 2022-09-28 DIAGNOSIS — R252 Cramp and spasm: Secondary | ICD-10-CM | POA: Diagnosis not present

## 2022-09-28 DIAGNOSIS — I6523 Occlusion and stenosis of bilateral carotid arteries: Secondary | ICD-10-CM | POA: Diagnosis not present

## 2022-09-29 DIAGNOSIS — R0602 Shortness of breath: Secondary | ICD-10-CM | POA: Diagnosis not present

## 2022-10-13 DIAGNOSIS — R509 Fever, unspecified: Secondary | ICD-10-CM | POA: Diagnosis not present

## 2022-10-13 DIAGNOSIS — R002 Palpitations: Secondary | ICD-10-CM | POA: Diagnosis not present

## 2022-10-18 ENCOUNTER — Other Ambulatory Visit: Payer: Self-pay

## 2022-10-18 ENCOUNTER — Encounter (HOSPITAL_BASED_OUTPATIENT_CLINIC_OR_DEPARTMENT_OTHER): Payer: Self-pay | Admitting: Emergency Medicine

## 2022-10-18 ENCOUNTER — Emergency Department (HOSPITAL_BASED_OUTPATIENT_CLINIC_OR_DEPARTMENT_OTHER): Payer: BC Managed Care – PPO

## 2022-10-18 ENCOUNTER — Emergency Department (HOSPITAL_BASED_OUTPATIENT_CLINIC_OR_DEPARTMENT_OTHER)
Admission: EM | Admit: 2022-10-18 | Discharge: 2022-10-18 | Disposition: A | Discharge status: Home / Self Care | Payer: BC Managed Care – PPO | Attending: Emergency Medicine | Admitting: Emergency Medicine

## 2022-10-18 DIAGNOSIS — J439 Emphysema, unspecified: Secondary | ICD-10-CM | POA: Diagnosis not present

## 2022-10-18 DIAGNOSIS — J209 Acute bronchitis, unspecified: Secondary | ICD-10-CM | POA: Diagnosis not present

## 2022-10-18 DIAGNOSIS — J9801 Acute bronchospasm: Secondary | ICD-10-CM | POA: Diagnosis not present

## 2022-10-18 DIAGNOSIS — Z87891 Personal history of nicotine dependence: Secondary | ICD-10-CM | POA: Insufficient documentation

## 2022-10-18 DIAGNOSIS — J40 Bronchitis, not specified as acute or chronic: Secondary | ICD-10-CM | POA: Diagnosis not present

## 2022-10-18 DIAGNOSIS — R0602 Shortness of breath: Secondary | ICD-10-CM | POA: Diagnosis not present

## 2022-10-18 DIAGNOSIS — E039 Hypothyroidism, unspecified: Secondary | ICD-10-CM | POA: Diagnosis not present

## 2022-10-18 DIAGNOSIS — R059 Cough, unspecified: Secondary | ICD-10-CM | POA: Diagnosis not present

## 2022-10-18 DIAGNOSIS — I700 Atherosclerosis of aorta: Secondary | ICD-10-CM | POA: Diagnosis not present

## 2022-10-18 DIAGNOSIS — R072 Precordial pain: Secondary | ICD-10-CM | POA: Diagnosis not present

## 2022-10-18 MED ORDER — METHYLPREDNISOLONE 4 MG PO TBPK: ORAL_TABLET | ORAL | 0 refills | Status: DC

## 2022-10-18 MED ORDER — AEROCHAMBER PLUS FLO-VU MEDIUM MISC
1.0000 | Freq: Once | Status: AC
  Administered 2022-10-18: 1
  Filled 2022-10-18: qty 1

## 2022-10-18 MED ORDER — IPRATROPIUM-ALBUTEROL 0.5-2.5 (3) MG/3ML IN SOLN
3.0000 mL | Freq: Once | RESPIRATORY_TRACT | Status: AC
  Administered 2022-10-18: 3 mL via RESPIRATORY_TRACT
  Filled 2022-10-18: qty 3

## 2022-10-18 MED ORDER — ALBUTEROL SULFATE HFA 108 (90 BASE) MCG/ACT IN AERS
2.0000 | INHALATION_SPRAY | Freq: Once | RESPIRATORY_TRACT | Status: AC
  Administered 2022-10-18: 2 via RESPIRATORY_TRACT
  Filled 2022-10-18: qty 6.7

## 2022-10-18 MED ORDER — PREDNISONE 50 MG PO TABS
60.0000 mg | ORAL_TABLET | Freq: Once | ORAL | Status: AC
  Administered 2022-10-18: 60 mg via ORAL
  Filled 2022-10-18: qty 1

## 2022-10-18 NOTE — Discharge Instructions (Addendum)
Use you inhaler 1-2 puffs every 4-6 hours for coughing and wheezing. Use over the counter Guaifenesin available in multiple cough medications to help expectorate phlegm;   Get help right away if: You have trouble breathing. Your wheezing and coughing do not get better after taking your medicine. You have chest pain. You have trouble speaking more than one-word sentences. These symptoms may be an emergency. Get help right away. Call 911. Do not wait to see if the symptoms will go away. Do not drive yourself to the hospital.

## 2022-10-18 NOTE — ED Triage Notes (Signed)
Pt arrives pov, steady gait, c/o CP, continued cough and midsternal CP, worse with deep inspiration x 5 days. Recently dx with flu, endorses concern for pneumonia

## 2022-10-18 NOTE — ED Provider Notes (Signed)
[facility] EMERGENCY DEPARTMENT Provider Note   CSN: [id number] Arrival date & time: 10/18/22  1209     History  Chief Complaint  Patient presents with   Shortness of Breath    Tracey Flowers is a 61 y.o. female With a past medical history of cigarette smoking and hypothyroidism, status post parathyroidectomy who presents emergency department with chief complaint of painful, productive cough.  Patient states that she was diagnosed with influenza last week.  Her symptoms began on Christmas morning.  3 days later she began having a severe painful cough.  Patient states that it keeps her awake when she tries to sleep.  At first she was having difficulty expectorating phlegm however now is having very thick phlegm.  She states that it is extremely painful when she coughs.  Patient quit smoking cigarettes 4 days prior to Christmas on December 21.  She denies a history of asthma or known COPD.  She denies fever or chills.  She denies nausea or vomiting.  She was prescribed promethazine-dextromethorphan cough medicine which has helped her to improve her sleep and has also been using Tylenol Motrin with some improvement of pain.   Shortness of Breath      Home Medications Prior to Admission medications   Medication Sig Start Date End Date Taking? Authorizing Provider  atorvastatin (LIPITOR) 40 MG tablet Take by mouth. 09/14/22  Yes [provider]  Cholecalciferol (VITAMIN D) 2000 UNITS tablet Take 2,000 Units by mouth daily.   Yes [provider]  levothyroxine (SYNTHROID, LEVOTHROID) 125 MCG tablet Take 125 mcg by mouth every evening.   Yes [provider]  liothyronine (CYTOMEL) 25 MCG tablet Take 12.5 mcg by mouth daily.   Yes [provider]  promethazine-dextromethorphan (PROMETHAZINE-DM) 6.25-15 MG/5ML syrup Take 5 mLs by mouth 4 (four) times daily as needed for cough. 10/16/22 10/23/22 Yes [provider]  Boric Acid Vaginal  600 MG SUPP Place 1 suppository vaginally as needed (twice a week).     [provider]  estradiol (ESTRACE) 0.1 MG/GM vaginal cream Place 1 Applicatorful vaginally as needed (twice a week).     [provider]      Allergies    Tetracyclines & related and Codeine    Review of Systems   Review of Systems  Respiratory:  Positive for shortness of breath.     Physical Exam Updated Vital Signs BP 135/61 (BP Location: Right Arm)   Pulse 85   Temp 97.6 F (36.4 C) (Oral)   Resp 20   Wt 53.6 kg   LMP 01/04/2014   SpO2 94%   BMI 20.60 kg/m  Physical Exam Vitals and nursing note reviewed.  Constitutional:      General: She is not in acute distress.    Appearance: She is well-developed. She is ill-appearing. She is not diaphoretic.  HENT:     Head: Normocephalic and atraumatic.     Right Ear: External ear normal.     Left Ear: External ear normal.     Nose: Nose normal.     Mouth/Throat:     Mouth: Mucous membranes are moist.  Eyes:     General: No scleral icterus.    Conjunctiva/sclera: Conjunctivae normal.  Cardiovascular:     Rate and Rhythm: Normal rate and regular rhythm.     Heart sounds: Normal heart sounds. No murmur heard.    No friction rub. No gallop.  Pulmonary:     Effort: Pulmonary effort is normal.  No respiratory distress.     Breath sounds: Examination of the right-upper field reveals rhonchi. Examination of the left-upper field reveals rhonchi. Examination of the right-middle field reveals rhonchi. Examination of the left-middle field reveals rhonchi. Examination of the right-lower field reveals rhonchi. Examination of the left-lower field reveals rhonchi. Rhonchi present.  Abdominal:     General: Bowel sounds are normal. There is no distension.     Palpations: Abdomen is soft. There is no mass.     Tenderness: There is no abdominal tenderness. There is no guarding.  Musculoskeletal:     Cervical back: Normal range of motion.  Skin:     General: Skin is warm and dry.  Neurological:     Mental Status: She is alert and oriented to person, place, and time.  Psychiatric:        Behavior: Behavior normal.     ED Results / Procedures / Treatments   Labs (all labs ordered are listed, but only abnormal results are displayed) Labs Reviewed - No data to display  EKG EKG Interpretation  Date/Time:  Sunday October 18 2022 12:53:48 EST Ventricular Rate:  84 PR Interval:  116 QRS Duration: 94 QT Interval:  368 QTC Calculation: 435 R Axis:   89 Text Interpretation: Sinus rhythm Borderline short PR interval Biatrial enlargement Nonspecific T abnrm, anterolateral leads Confirmed by Marily Memos [phone]) on 10/18/2022 4:19:53 PM  Radiology DG Chest 2 View  Result Date: 10/18/2022 CLINICAL DATA:  Chest pain and shortness of breath. Midsternal chest pain worse with deep inspiration for 5 days. EXAM: CHEST - 2 VIEW COMPARISON:  Chest two views 01/22/2012 FINDINGS: Cardiac silhouette and mediastinal contours are within normal limits. Mild calcification within the aortic arch. Flattening of the diaphragms and mild-to-moderate hyperinflation, similar to prior. Increased lucencies are again seen within the bilateral upper lungs with attenuation of the pulmonary vasculature, likely chronic emphysematous changes. No acute lung airspace opacity. No pulmonary edema, pleural effusion, or pneumothorax. Mild multilevel degenerative disc changes of the upper thoracic spine. IMPRESSION: 1. No acute cardiopulmonary process. 2. Chronic hyperinflation and emphysematous changes, similar to prior. Electronically Signed   By: Neita Garnet M.D.   On: 10/18/2022 13:17    Procedures Procedures    Medications Ordered in ED Medications  ipratropium-albuterol (DUONEB) 0.5-2.5 (3) MG/3ML nebulizer solution 3 mL (has no administration in time range)  predniSONE (DELTASONE) tablet 60 mg (has no administration in time range)    ED Course/ Medical Decision  Making/ A&P                           Medical Decision Making 61 year old female here with cough after recent flu.  I visualized and interpreted and reviewed all findings of the patient's chest x-ray at bedside with the patient including evidence of aortic atherosclerosis, and changes consistent with a COPD.  I have encouraged the patient to continue her journey in discontinuation of smoking cigarettes. After review of all data points and most workup I suspect she has bronchitis and bronchospasm.  Treated here with DuoNeb.  Will discharge with an inhaler and Medrol Dosepak.  Discussed outpatient follow-up and return precautions.  Amount and/or Complexity of Data Reviewed Radiology: ordered and independent interpretation performed. ECG/medicine tests: ordered and independent interpretation performed.  Risk Prescription drug management.           Final Clinical Impression(s) / ED Diagnoses Final diagnoses:  Acute bronchitis with bronchospasm  Pulmonary emphysema, unspecified emphysema type (  [facility])  Aortic calcification [facility])    Rx / DC Orders ED Discharge Orders     None         Arthor Captain, PA-C 10/18/22 2328    Mesner, Barbara Cower, MD 10/20/22 [phone]

## 2022-10-20 DIAGNOSIS — R0789 Other chest pain: Secondary | ICD-10-CM | POA: Diagnosis not present

## 2022-10-20 DIAGNOSIS — J111 Influenza due to unidentified influenza virus with other respiratory manifestations: Secondary | ICD-10-CM | POA: Diagnosis not present

## 2023-01-28 DIAGNOSIS — S92515A Nondisplaced fracture of proximal phalanx of left lesser toe(s), initial encounter for closed fracture: Secondary | ICD-10-CM | POA: Diagnosis not present

## 2023-04-04 DIAGNOSIS — H6691 Otitis media, unspecified, right ear: Secondary | ICD-10-CM | POA: Diagnosis not present

## 2023-05-03 DIAGNOSIS — Z1231 Encounter for screening mammogram for malignant neoplasm of breast: Secondary | ICD-10-CM | POA: Diagnosis not present

## 2023-05-03 DIAGNOSIS — Z01419 Encounter for gynecological examination (general) (routine) without abnormal findings: Secondary | ICD-10-CM | POA: Diagnosis not present

## 2023-05-03 DIAGNOSIS — Z6820 Body mass index (BMI) 20.0-20.9, adult: Secondary | ICD-10-CM | POA: Diagnosis not present

## 2023-06-14 DIAGNOSIS — L089 Local infection of the skin and subcutaneous tissue, unspecified: Secondary | ICD-10-CM | POA: Diagnosis not present

## 2023-06-14 DIAGNOSIS — S61219A Laceration without foreign body of unspecified finger without damage to nail, initial encounter: Secondary | ICD-10-CM | POA: Diagnosis not present

## 2023-08-09 DIAGNOSIS — D492 Neoplasm of unspecified behavior of bone, soft tissue, and skin: Secondary | ICD-10-CM | POA: Diagnosis not present

## 2023-08-09 DIAGNOSIS — N3001 Acute cystitis with hematuria: Secondary | ICD-10-CM | POA: Diagnosis not present

## 2023-08-09 DIAGNOSIS — D229 Melanocytic nevi, unspecified: Secondary | ICD-10-CM | POA: Diagnosis not present

## 2023-08-09 DIAGNOSIS — R202 Paresthesia of skin: Secondary | ICD-10-CM | POA: Diagnosis not present

## 2023-08-09 DIAGNOSIS — R350 Frequency of micturition: Secondary | ICD-10-CM | POA: Diagnosis not present

## 2023-08-09 DIAGNOSIS — L821 Other seborrheic keratosis: Secondary | ICD-10-CM | POA: Diagnosis not present

## 2023-08-09 DIAGNOSIS — D485 Neoplasm of uncertain behavior of skin: Secondary | ICD-10-CM | POA: Diagnosis not present

## 2023-08-24 DIAGNOSIS — N390 Urinary tract infection, site not specified: Secondary | ICD-10-CM | POA: Diagnosis not present

## 2023-08-25 DIAGNOSIS — N390 Urinary tract infection, site not specified: Secondary | ICD-10-CM | POA: Diagnosis not present

## 2023-08-26 DIAGNOSIS — D0372 Melanoma in situ of left lower limb, including hip: Secondary | ICD-10-CM | POA: Diagnosis not present

## 2023-09-13 DIAGNOSIS — I6523 Occlusion and stenosis of bilateral carotid arteries: Secondary | ICD-10-CM | POA: Diagnosis not present

## 2023-11-22 ENCOUNTER — Telehealth (HOSPITAL_BASED_OUTPATIENT_CLINIC_OR_DEPARTMENT_OTHER): Payer: Self-pay | Admitting: Acute Care

## 2023-11-22 NOTE — Telephone Encounter (Signed)
Monique referral coordinator  from [facility] calling to schedule patient for an emergency referral regarding multiple large lung nodules and largest being 8mm after 1/29 and 2/1 scan.   Advised Monique patient cannot be seen until CT findings and referral has been received and gave her the fax number for [facility].

## 2023-12-08 ENCOUNTER — Institutional Professional Consult (permissible substitution): Payer: BC Managed Care – PPO | Admitting: Acute Care

## 2024-03-27 ENCOUNTER — Other Ambulatory Visit: Payer: Self-pay

## 2024-04-11 ENCOUNTER — Encounter: Payer: Self-pay | Admitting: Gastroenterology

## 2024-04-11 ENCOUNTER — Ambulatory Visit (AMBULATORY_SURGERY_CENTER)

## 2024-04-11 VITALS — Ht 63.5 in | Wt 117.0 lb

## 2024-04-11 DIAGNOSIS — K5909 Other constipation: Secondary | ICD-10-CM

## 2024-04-11 MED ORDER — NA SULFATE-K SULFATE-MG SULF 17.5-3.13-1.6 GM/177ML PO SOLN: 1.0000 | Freq: Once | ORAL | 0 refills | Status: AC

## 2024-04-11 NOTE — Progress Notes (Signed)
 No egg or soy allergy known to patient  No issues known to pt with past sedation with any surgeries or procedures Patient denies ever being told they had issues or difficulty with intubation  No FH of Malignant Hyperthermia Pt is not on diet pills Pt is not on  home 02  Pt is not on blood thinners  Pt denies issues with constipation- HX of constipation  No A fib or A flutter Have any cardiac testing pending--NO Pt can ambulate-INDEPENDENTLY Pt denies use of chewing tobacco Discussed diabetic I weight loss medication holds Discussed NSAID holds Checked BMI Pt instructed to use [employer] or GoodRx for a price reduction on prep  Patient's chart reviewed by Norleen Schillings CNRA prior to previsit and patient appropriate for the [facility].  Pre visit completed and red dot placed by patient's name on their procedure day (on provider's schedule).

## 2024-05-04 ENCOUNTER — Telehealth: Payer: Self-pay | Admitting: Gastroenterology

## 2024-05-04 NOTE — Telephone Encounter (Signed)
 Inbound call from patient requesting f/u call in regards to prep medication. Please advise.

## 2024-05-04 NOTE — Telephone Encounter (Signed)
 Returned pt's call. She states she has a very lazy colon and is concerned that the Suprep may not be adequate enough to get her bowels clear for the colonoscopy. States she has been taking the daily 17gm Miralax  per instructions but she has not had any results. RN gave instructions for pt to take 4 dulcolax tablets and 119gm Miralax  in 32oz of clear liquids this morning and then continue with Suprep per instructions. Asked pt to call tomorrow prior to coming in for appointment if her bowels are not clear. Pt verbalized understanding.

## 2024-05-05 ENCOUNTER — Encounter: Payer: Self-pay | Admitting: Gastroenterology

## 2024-05-05 ENCOUNTER — Ambulatory Visit (AMBULATORY_SURGERY_CENTER): Admitting: Gastroenterology

## 2024-05-05 VITALS — BP 108/70 | HR 60 | Temp 98.1°F | Resp 26 | Ht 63.0 in | Wt 117.0 lb

## 2024-05-05 DIAGNOSIS — K5909 Other constipation: Secondary | ICD-10-CM

## 2024-05-05 DIAGNOSIS — K644 Residual hemorrhoidal skin tags: Secondary | ICD-10-CM | POA: Diagnosis not present

## 2024-05-05 DIAGNOSIS — Z1211 Encounter for screening for malignant neoplasm of colon: Secondary | ICD-10-CM

## 2024-05-05 DIAGNOSIS — K648 Other hemorrhoids: Secondary | ICD-10-CM | POA: Diagnosis not present

## 2024-05-05 DIAGNOSIS — D128 Benign neoplasm of rectum: Secondary | ICD-10-CM

## 2024-05-05 DIAGNOSIS — K621 Rectal polyp: Secondary | ICD-10-CM

## 2024-05-05 MED ORDER — SODIUM CHLORIDE 0.9 % IV SOLN: 500.0000 mL | Freq: Once | INTRAVENOUS | Status: DC

## 2024-05-05 NOTE — Progress Notes (Signed)
 Sedate, gd SR, tolerated procedure well, VSS, report to RN

## 2024-05-05 NOTE — Progress Notes (Signed)
 [facility] History and Physical   Primary Care Physician:  Brien Charleston, MD   Reason for Procedure:  Colorectal cancer screening  Plan:    Screening colonoscopy with possible interventions as needed     HPI: Tracey Flowers is a very pleasant 63 y.o. female here for screening colonoscopy. Denies any nausea, vomiting, abdominal pain, melena or bright red blood per rectum  The risks and benefits as well as alternatives of endoscopic procedure(s) have been discussed and reviewed. All questions answered. The patient agrees to proceed.    Past Medical History:  Diagnosis Date   Allergy    dust mites   Bronchitis    Chronic constipation    Hyperlipidemia    Hypotension    Intussusception of jejunum (HCC)    Meningitis    01/2012   Pneumonia    Thyroid  disorder     Past Surgical History:  Procedure Laterality Date   ELBOW SURGERY     Right   FOOT SURGERY Left    2022, 2023   left hand surgery     PAROTIDECTOMY     right foot surgery     TONSILLECTOMY      Prior to Admission medications   Medication Sig Start Date End Date Taking? Authorizing Provider  atorvastatin (LIPITOR) 40 MG tablet Take by mouth. 09/14/22  Yes [provider]  Cholecalciferol (VITAMIN D) 2000 UNITS tablet Take 2,000 Units by mouth daily.   Yes [provider]  levothyroxine  (SYNTHROID , LEVOTHROID) 125 MCG tablet Take 125 mcg by mouth every evening.   Yes [provider]  liothyronine  (CYTOMEL ) 25 MCG tablet Take 12.5 mcg by mouth daily.   Yes [provider]  Boric Acid Vaginal 600 MG SUPP Place 1 suppository vaginally as needed (twice a week).  Patient not taking: Reported on 04/11/2024    [provider]  estradiol (ESTRACE) 0.1 MG/GM vaginal cream Place 1 Applicatorful vaginally as needed (twice a week).     [provider]    Current Outpatient Medications  Medication Sig Dispense Refill   atorvastatin (LIPITOR) 40 MG  tablet Take by mouth.     Cholecalciferol (VITAMIN D) 2000 UNITS tablet Take 2,000 Units by mouth daily.     levothyroxine  (SYNTHROID , LEVOTHROID) 125 MCG tablet Take 125 mcg by mouth every evening.     liothyronine  (CYTOMEL ) 25 MCG tablet Take 12.5 mcg by mouth daily.     Boric Acid Vaginal 600 MG SUPP Place 1 suppository vaginally as needed (twice a week).  (Patient not taking: Reported on 04/11/2024)     estradiol (ESTRACE) 0.1 MG/GM vaginal cream Place 1 Applicatorful vaginally as needed (twice a week).      Current Facility-Administered Medications  Medication Dose Route Frequency Provider Last Rate Last Admin   0.9 %  sodium chloride  infusion  500 mL Intravenous Once Deitrick Ferreri V, MD        Allergies as of 05/05/2024 - Review Complete 05/05/2024  Allergen Reaction Noted   Tetracyclines & related Anaphylaxis 06/11/2011   Codeine Nausea And Vomiting 01/22/2012    Family History  Problem Relation Age of Onset   Leukemia Maternal Grandmother    Colon cancer Neg Hx    Esophageal cancer Neg Hx    Rectal cancer Neg Hx    Stomach cancer Neg Hx     Social History   Socioeconomic History   Marital status: Divorced    Spouse name: Not on file   Number of children: 2  Years of education: Not on file   Highest education level: Not on file  Occupational History   Occupation: Licensed conveyancer  Tobacco Use   Smoking status: Former    Current packs/day: 0.50    Average packs/day: 0.5 packs/day for 15.0 years (7.5 ttl pk-yrs)    Types: Cigarettes   Smokeless tobacco: Never   Tobacco comments:    Quit 1 1/2 ago  Vaping Use   Vaping status: Never Used  Substance and Sexual Activity   Alcohol use: Yes    Comment: socially   Drug use: No   Sexual activity: Yes    Birth control/protection: None  Other Topics Concern   Not on file  Social History Narrative   Not on file   Social Drivers of Health   Financial Resource Strain: Patient Declined (01/24/2024)    Received from [facility]   Overall Financial Resource Strain (CARDIA)    Difficulty of Paying Living Expenses: Patient declined  Food Insecurity: No Food Insecurity (11/01/2023)   Received from [facility]   Hunger Vital Sign    Within the past 12 months, you worried that your food would run out before you got the money to buy more.: Never true    Within the past 12 months, the food you bought just didn't last and you didn't have money to get more.: Never true  Transportation Needs: No Transportation Needs (11/01/2023)   Received from [facility]    Lack of Transportation (Medical): No    Lack of Transportation (Non-Medical): No  Physical Activity: Unknown (10/13/2022)   [facility] from [facility]   Exercise Vital Sign    On average, how many days per week do you engage in moderate to strenuous exercise (like a brisk walk)?: 2 days    Minutes of Exercise per Session: Not on file  Stress: Not on file  Social Connections: Unknown (02/27/2022)   Received from [facility]   Social Network    Social Network: Not on file  Intimate Partner Violence: Not At Risk (10/22/2023)   Received from [facility]   HITS    Over the last 12 months how often did your partner physically hurt you?: Never    Over the last 12 months how often did your partner insult you or talk down to you?: Never    Over the last 12 months how often did your partner threaten you with physical harm?: Never    Over the last 12 months how often did your partner scream or curse at you?: Never    Review of Systems:  All other review of systems negative except as mentioned in the HPI.  Physical Exam: Vital signs in last 24 hours: BP 108/74   Pulse 78   Temp 98.1 F (36.7 C)   Ht 5' 3 (1.6 m)   Wt 117 lb (53.1 kg)   LMP 01/04/2014   SpO2 98%   BMI 20.73 kg/m  General:   Alert, NAD Lungs:  Clear .   Heart:  Regular rate and rhythm Abdomen:  Soft, nontender and  nondistended. Neuro/Psych:  Alert and cooperative. Normal mood and affect. A and O x 3  Reviewed labs, radiology imaging, old records and pertinent past GI work up  Patient is appropriate for planned procedure(s) and anesthesia in an ambulatory setting   K. Veena Salli Bodin , MD [phone]

## 2024-05-05 NOTE — Patient Instructions (Signed)

## 2024-05-05 NOTE — Progress Notes (Signed)
 Called to room to assist during endoscopic procedure.  Patient ID and intended procedure confirmed with present staff. Received instructions for my participation in the procedure from the performing physician.

## 2024-05-05 NOTE — Op Note (Signed)
 [facility] Patient Name: Tracey Flowers Procedure Date: 05/05/2024 1:37 PM MRN: [id number] Endoscopist: Gustav ALONSO Mcgee , MD, [id number] Age: 63 Referring MD:  Date of Birth: 29-Jul-1961 Gender: Female Account #: [id number] Procedure:                Colonoscopy Indications:              Screening for colorectal malignant neoplasm Medicines:                Monitored Anesthesia Care Procedure:                Pre-Anesthesia Assessment:                           - Prior to the procedure, a History and Physical                            was performed, and patient medications and                            allergies were reviewed. The patient's tolerance of                            previous anesthesia was also reviewed. The risks                            and benefits of the procedure and the sedation                            options and risks were discussed with the patient.                            All questions were answered, and informed consent                            was obtained. Prior Anticoagulants: The patient has                            taken no anticoagulant or antiplatelet agents. ASA                            Grade Assessment: II - A patient with mild systemic                            disease. After reviewing the risks and benefits,                            the patient was deemed in satisfactory condition to                            undergo the procedure.                           After obtaining informed consent, the colonoscope  was passed under direct vision. Throughout the                            procedure, the patient's blood pressure, pulse, and                            oxygen saturations were monitored continuously. The                            Olympus Scope SN [id number] was introduced through the                            anus and advanced to the the cecum, identified by                             appendiceal orifice and ileocecal valve. The                            colonoscopy was performed without difficulty. The                            patient tolerated the procedure well. The quality                            of the bowel preparation was good. The ileocecal                            valve, appendiceal orifice, and rectum were                            photographed. Scope In: 1:51:08 PM Scope Out: 2:14:49 PM Scope Withdrawal Time: 0 hours 9 minutes 28 seconds  Total Procedure Duration: 0 hours 23 minutes 41 seconds  Findings:                 The perianal and digital rectal examinations were                            normal.                           Two sessile polyps were found in the rectum. The                            polyps were 3 to 8 mm in size. These polyps were                            removed with a cold snare. Resection and retrieval                            were complete.                           Non-bleeding external and internal hemorrhoids were  found during retroflexion. The hemorrhoids were                            medium-sized. Complications:            No immediate complications. Estimated Blood Loss:     Estimated blood loss was minimal. Impression:               - Two 3 to 8 mm polyps in the rectum, removed with                            a cold snare. Resected and retrieved.                           - Non-bleeding external and internal hemorrhoids. Recommendation:           - Patient has a contact number available for                            emergencies. The signs and symptoms of potential                            delayed complications were discussed with the                            patient. Return to normal activities tomorrow.                            Written discharge instructions were provided to the                            patient.                           - Resume previous diet.                            - Continue present medications.                           - Await pathology results.                           - Repeat colonoscopy in 5-10 years for surveillance                            based on pathology results. Aryana Wonnacott V. Ivory Bail, MD 05/05/2024 2:21:28 PM This report has been signed electronically.

## 2024-05-08 ENCOUNTER — Telehealth: Payer: Self-pay | Admitting: *Deleted

## 2024-05-08 NOTE — Telephone Encounter (Signed)
  Follow up Call-     05/05/2024    1:12 PM  Call back number  Post procedure Call Back phone  # [phone]  Permission to leave phone message Yes     Patient questions:  Message left to call us  if necessary.

## 2024-05-10 LAB — SURGICAL PATHOLOGY

## 2024-08-31 ENCOUNTER — Ambulatory Visit: Payer: Self-pay | Admitting: Gastroenterology
